# Patient Record
Sex: Female | Born: 1945 | Race: White | Hispanic: No | Smoking: Former smoker
Health system: Southern US, Community
[De-identification: ages and names within clinical notes are randomized; demographics above are authoritative.]

## PROBLEM LIST (undated history)

## (undated) DIAGNOSIS — D649 Anemia, unspecified: Secondary | ICD-10-CM

## (undated) DIAGNOSIS — C4490 Unspecified malignant neoplasm of skin, unspecified: Secondary | ICD-10-CM

## (undated) DIAGNOSIS — K219 Gastro-esophageal reflux disease without esophagitis: Secondary | ICD-10-CM

## (undated) DIAGNOSIS — M1990 Unspecified osteoarthritis, unspecified site: Secondary | ICD-10-CM

## (undated) DIAGNOSIS — H532 Diplopia: Secondary | ICD-10-CM

## (undated) DIAGNOSIS — E039 Hypothyroidism, unspecified: Secondary | ICD-10-CM

## (undated) DIAGNOSIS — C801 Malignant (primary) neoplasm, unspecified: Secondary | ICD-10-CM

## (undated) DIAGNOSIS — N321 Vesicointestinal fistula: Secondary | ICD-10-CM

## (undated) HISTORY — DX: Unspecified malignant neoplasm of skin, unspecified: C44.90

## (undated) HISTORY — PX: APPENDECTOMY: SHX54

## (undated) HISTORY — PX: CHOLECYSTECTOMY: SHX55

## (undated) HISTORY — DX: Diplopia: H53.2

## (undated) HISTORY — PX: TONSILLECTOMY: SUR1361

---

## 2013-12-05 ENCOUNTER — Encounter (HOSPITAL_COMMUNITY): Payer: Self-pay | Admitting: Emergency Medicine

## 2013-12-05 ENCOUNTER — Emergency Department (HOSPITAL_COMMUNITY): Payer: Medicare Other

## 2013-12-05 ENCOUNTER — Emergency Department (HOSPITAL_COMMUNITY)
Admission: EM | Admit: 2013-12-05 | Discharge: 2013-12-05 | Disposition: A | Discharge status: Home / Self Care | Payer: Medicare Other | Attending: Emergency Medicine | Admitting: Emergency Medicine

## 2013-12-05 DIAGNOSIS — IMO0002 Reserved for concepts with insufficient information to code with codable children: Secondary | ICD-10-CM | POA: Insufficient documentation

## 2013-12-05 DIAGNOSIS — M5416 Radiculopathy, lumbar region: Secondary | ICD-10-CM

## 2013-12-05 LAB — URINALYSIS, ROUTINE W REFLEX MICROSCOPIC
Bilirubin Urine: NEGATIVE
Glucose, UA: NEGATIVE mg/dL
Hgb urine dipstick: NEGATIVE
Ketones, ur: 15 mg/dL — AB
Leukocytes, UA: NEGATIVE
Nitrite: NEGATIVE
Protein, ur: NEGATIVE mg/dL
Specific Gravity, Urine: 1.015 (ref 1.005–1.030)
Urobilinogen, UA: 0.2 mg/dL (ref 0.0–1.0)
pH: 6 (ref 5.0–8.0)

## 2013-12-05 MED ORDER — PREDNISONE 20 MG PO TABS: 40.0000 mg | ORAL_TABLET | Freq: Every day | ORAL | Status: DC

## 2013-12-05 MED ORDER — HYDROCODONE-ACETAMINOPHEN 5-325 MG PO TABS
1.0000 | ORAL_TABLET | Freq: Once | ORAL | Status: AC
  Administered 2013-12-05: 1 via ORAL
  Filled 2013-12-05: qty 1

## 2013-12-05 MED ORDER — HYDROCODONE-ACETAMINOPHEN 5-325 MG PO TABS: 1.0000 | ORAL_TABLET | ORAL | Status: DC | PRN

## 2013-12-05 MED ORDER — KETOROLAC TROMETHAMINE 30 MG/ML IJ SOLN
30.0000 mg | Freq: Once | INTRAMUSCULAR | Status: AC
  Administered 2013-12-05: 30 mg via INTRAMUSCULAR
  Filled 2013-12-05: qty 1

## 2013-12-05 MED ORDER — ONDANSETRON 4 MG PO TBDP
4.0000 mg | ORAL_TABLET | Freq: Once | ORAL | Status: AC
  Administered 2013-12-05: 4 mg via ORAL
  Filled 2013-12-05: qty 1

## 2013-12-05 NOTE — ED Notes (Signed)
The pt is c/o lower back lt hipandlt leg pain for 3 days.  Worse last pm

## 2013-12-05 NOTE — ED Notes (Signed)
The pt has had back pain fpr months

## 2013-12-05 NOTE — ED Provider Notes (Addendum)
CSN: [id number]     Arrival date & time 12/05/13  0701 History   First MD Initiated Contact with Patient 12/05/13 [phone]     Chief Complaint  Patient presents with  . Back Pain     (Consider location/radiation/quality/duration/timing/severity/associated sxs/prior Treatment) Patient is a 68 y.o. female presenting with back pain. The history is provided by the patient.  Back Pain Location:  Lumbar spine Quality:  Aching, stabbing, stiffness and shooting Stiffness is present:  In the morning Radiates to:  L thigh (left hip and sometimes shoots down to the knee) Pain severity:  Severe Onset quality:  Gradual Duration:  3 days (states has had some mild intermittent pain in the past but nothing like the last 3 days) Timing:  Constant Progression:  Worsening Chronicity:  New Context comment:  Can't recall injury or trigger Relieved by:  Nothing Worsened by:  Ambulation, bending, standing and twisting Ineffective treatments:  Ibuprofen (aleve, icy hot) Associated symptoms: leg pain   Associated symptoms: no abdominal pain, no bladder incontinence, no bowel incontinence, no chest pain, no dysuria, no fever, no numbness, no paresthesias and no weakness   Risk factors: menopause   Risk factors: no hx of cancer, no recent surgery and no steroid use     History reviewed. No pertinent past medical history. History reviewed. No pertinent past surgical history. No family history on file. History  Substance Use Topics  . Smoking status: Never Smoker   . Smokeless tobacco: Not on file  . Alcohol Use: No   OB History   Grav Para Term Preterm Abortions TAB SAB Ect Mult Living                 Review of Systems  Constitutional: Negative for fever.  Cardiovascular: Negative for chest pain.  Gastrointestinal: Negative for abdominal pain and bowel incontinence.  Genitourinary: Negative for bladder incontinence and dysuria.  Musculoskeletal: Positive for back pain.  Neurological: Negative for  weakness, numbness and paresthesias.  All other systems reviewed and are negative.     Allergies  Review of patient's allergies indicates no known allergies.  Home Medications   Prior to Admission medications   Not on File   BP 168/68  Pulse 60  Temp(Src) 98.2 F (36.8 C) (Oral)  Resp 18  SpO2 98% Physical Exam  Nursing note and vitals reviewed. Constitutional: She is oriented to person, place, and time. She appears well-developed and well-nourished.  Appears uncomfortable  HENT:  Head: Normocephalic and atraumatic.  Eyes: EOM are normal. Pupils are equal, round, and reactive to light.  Cardiovascular: Normal rate, regular rhythm, normal heart sounds and intact distal pulses.  Exam reveals no friction rub.   No murmur heard. Pulmonary/Chest: Effort normal and breath sounds normal. She has no wheezes. She has no rales.  Abdominal: Soft. Bowel sounds are normal. She exhibits no distension. There is no tenderness. There is no rebound, no guarding and no CVA tenderness.  Musculoskeletal: Normal range of motion. She exhibits no tenderness.  No reproducible pain with palpation of the left hip or back.  Pain reproduced by pt movement.  2+ DP pulses on the left without edema  Neurological: She is alert and oriented to person, place, and time. She has normal strength. No cranial nerve deficit or sensory deficit.  Skin: Skin is warm and dry. No rash noted.  Psychiatric: She has a normal mood and affect. Her behavior is normal.    ED Course  Procedures (including critical care time) Labs Review  Labs Reviewed  URINALYSIS, ROUTINE W REFLEX MICROSCOPIC - Abnormal; Notable for the following:    Ketones, ur 15 (*)    All other components within normal limits    Imaging Review Dg Hip Complete Left  12/05/2013   CLINICAL DATA:  Three-day history of acute back and left hip pain without history of injury; three-month history of milder similar symptoms.  EXAM: LEFT HIP - COMPLETE 2+ VIEW   COMPARISON:  None.  FINDINGS: The bony pelvis is mildly osteopenic. There is no lytic or blastic lesion nor acute fracture. 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 The hip joint spaces are reasonably well-maintained for age. The femoral heads and necks and intertrochanteric regions are normal. The frog-leg lateral view of the left hip reveals no acute abnormality.  IMPRESSION: There is minimal age appropriate degenerative change of the hips but there is no acute bony abnormality.   Electronically Signed   By: David  Martinique   On: 12/05/2013 08:25     EKG Interpretation None      MDM   Final diagnoses:  Lumbar radiculopathy, acute    Pt with gradual onset of left hip pain which is most suggestive of radiculopathy in the L2-L3 distribution.  No neurovascular compromise and no incontinence.  Pt has no infectious sx, hx of CA  or other red flags concerning for pathologic back pain.  She has had mild pain in the past but nothing persistent.  No flank pain and low concern for kidney stone, UTI or AAA.  Pt is able to ambulate but is painful.  Normal strength and reflexes on exam.  Denies trauma. Hip films pending. Will give pt pain control and to return for developement of above sx.  9:31 AM UA without blood or infection.  Low suspicion from ks.  HIp film without pathologic lesions or other acute issues.  Feel pt has lumbar radiculopathy and treated conservatively.   Blanchie Dessert, MD 12/05/13 1027  Blanchie Dessert, MD 12/05/13 [phone]

## 2013-12-05 NOTE — Discharge Instructions (Signed)

## 2014-08-26 ENCOUNTER — Other Ambulatory Visit: Payer: Self-pay | Admitting: Orthopedic Surgery

## 2014-08-26 DIAGNOSIS — Z7409 Other reduced mobility: Secondary | ICD-10-CM

## 2014-08-26 DIAGNOSIS — M2560 Stiffness of unspecified joint, not elsewhere classified: Secondary | ICD-10-CM

## 2014-08-26 DIAGNOSIS — R52 Pain, unspecified: Secondary | ICD-10-CM

## 2014-09-10 ENCOUNTER — Ambulatory Visit
Admission: RE | Admit: 2014-09-10 | Discharge: 2014-09-10 | Disposition: A | Discharge status: Home / Self Care | Payer: Medicare Other | Source: Ambulatory Visit | Attending: Orthopedic Surgery | Admitting: Orthopedic Surgery

## 2014-09-10 DIAGNOSIS — M2560 Stiffness of unspecified joint, not elsewhere classified: Secondary | ICD-10-CM

## 2014-09-10 DIAGNOSIS — R52 Pain, unspecified: Secondary | ICD-10-CM

## 2014-09-10 DIAGNOSIS — Z7409 Other reduced mobility: Secondary | ICD-10-CM

## 2014-10-13 ENCOUNTER — Encounter: Payer: Self-pay | Admitting: Physical Therapy

## 2014-10-13 ENCOUNTER — Ambulatory Visit: Payer: Medicare Other | Attending: Orthopedic Surgery | Admitting: Physical Therapy

## 2014-10-13 DIAGNOSIS — M25562 Pain in left knee: Secondary | ICD-10-CM | POA: Insufficient documentation

## 2014-10-13 DIAGNOSIS — R29898 Other symptoms and signs involving the musculoskeletal system: Secondary | ICD-10-CM | POA: Diagnosis not present

## 2014-10-13 DIAGNOSIS — M25662 Stiffness of left knee, not elsewhere classified: Secondary | ICD-10-CM

## 2014-10-13 NOTE — Therapy (Signed)
[place], [place], [address] Phone: [phone]   Fax:  [phone]  Physical Therapy Evaluation  Patient Details  Name: Jackie Snow MRN: [id number] Date of Birth: 1946/01/23 Referring Provider:  Vickey Huger, MD  Encounter Date: 10/13/2014      PT End of Session - 10/13/14 0833    Visit Number 1   PT Start Time 7342   PT Stop Time 0836   PT Time Calculation (min) 41 min      History reviewed. No pertinent past medical history.  History reviewed. No pertinent past surgical history.  There were no vitals filed for this visit.  Visit Diagnosis:  Left knee pain - Plan: PT plan of care cert/re-cert  Decreased ROM of left knee - Plan: PT plan of care cert/re-cert      Subjective Assessment - 10/13/14 0756    Subjective Patient underwent a left knee arthroscopy on 09/28/14, a meniscus debridement.  Reports that she has had some pain, but biggest c/o is difficulty walking after sitting   Limitations Sitting;House hold activities;Walking   How long can you sit comfortably? 20   How long can you stand comfortably? 20   How long can you walk comfortably? 45   Patient Stated Goals no pain and easier walking   Currently in Pain? Yes   Pain Score 3    Pain Location Knee   Pain Orientation Left   Pain Descriptors / Indicators Aching;Tightness   Pain Type Surgical pain   Pain Onset More than a month ago   Pain Frequency Intermittent   Aggravating Factors  walking, standing, sitting for a long period pain will be up to 6/10   Pain Relieving Factors rest, ice and elevation  can have no pain   Effect of Pain on Daily Activities difficulty with ADL's            [facility] PT Assessment - 10/13/14 0001    Assessment   Medical Diagnosis s/p left knee scope   Onset Date 09/28/14   Precautions   Precautions None   Restrictions   Weight Bearing Restrictions No   Balance Screen   Has the patient  fallen in the past 6 months No   Has the patient had a decrease in activity level because of a fear of falling?  No   Is the patient reluctant to leave their home because of a fear of falling?  No   Home Environment   Additional Comments Does her own housework, has stairs at home   Prior Function   Vocation Requirements some walking on uneven terrain, stairs and a lot of sitting   Leisure was walking for exercise   ROM / Strength   AROM / PROM / Strength --  AROM of the left knee 10-92 degrees flexion, PROM 0-105   Flexibility   Soft Tissue Assessment /Muscle Length --  tight HS and calf as well as ITB   Ambulation/Gait   Gait Comments stairs are one at a time, slow for the first few steps with antalgic gait on the left, no device                   [facility] Adult PT Treatment/Exercise - 10/13/14 0001    Knee/Hip Exercises: Aerobic   Stationary Bike level 0 x 5 minutes   Isokinetic Nu Step Level 4 x 6 minutes  PT Education - 10/13/14 [phone]    Education provided Yes   Education Details quad and VMO activation, RICE and calf stretches   Person(s) Educated Patient   Methods Explanation;Demonstration;Verbal cues   Comprehension Verbalized understanding;Returned demonstration;Tactile cues required          PT Short Term Goals - 10/13/14 0835    PT SHORT TERM GOAL #1   Title independent with initial HEP   Time 1   Period Weeks   Status New           PT Long Term Goals - 10/13/14 1829    PT LONG TERM GOAL #1   Title decrease pain 50%   Time 8   Period Weeks   Status New   PT LONG TERM GOAL #2   Title increase AROM of the left knee to 3-115 degrees flexion   Time 8   Period Weeks   Status New   PT LONG TERM GOAL #3   Title walk all community distances without difficulty   Time 8   Period Weeks   Status New   PT LONG TERM GOAL #4   Title go up and down stairs step over step   Time 8   Period Weeks   Status New                Plan - 10/13/14 9371    Clinical Impression Statement Patient with left knee pain and stiffness after left knee scope with menicus debridement on 09/28/14.  Having difficulty with stairs and difficulty walking after sitting.   Pt will benefit from skilled therapeutic intervention in order to improve on the following deficits Abnormal gait;Decreased mobility;Decreased range of motion;Difficulty walking;Increased edema;Pain   Rehab Potential Good   PT Frequency 1x / week   PT Duration 6 weeks   PT Treatment/Interventions Electrical Stimulation;Cryotherapy;Stair training;Gait training;Therapeutic exercise;Patient/family education;Manual techniques   PT Next Visit Plan add gym exercises   Consulted and Agree with Plan of Care Patient         Problem List There are no active problems to display for this patient.   Sumner Boast, PT 10/13/2014, 8:38 AM  [address] [place] [place], [place], [address] Phone: [phone]   Fax:  [phone]

## 2014-10-20 ENCOUNTER — Ambulatory Visit: Payer: Medicare Other | Admitting: Physical Therapy

## 2014-10-20 DIAGNOSIS — M25662 Stiffness of left knee, not elsewhere classified: Secondary | ICD-10-CM

## 2014-10-20 DIAGNOSIS — M25562 Pain in left knee: Secondary | ICD-10-CM | POA: Diagnosis not present

## 2014-10-20 NOTE — Patient Instructions (Addendum)
Hip Flexion / Knee Extension: Straight-Leg Raise (Eccentric)   Lie on back. Lift leg with knee straight. Slowly lower leg for 3-5 seconds. 10___ reps per set, _1-3__ sets per day, __7 days per week. Lower like elevator, stopping at each floor.    ABDUCTION: Side-Lying (Active)   Lie on left side, top leg straight. Raise top leg as far as possible. Use _0__ lbs. Complete __1-3_ sets of _10__ repetitions. Perform _1__ sessions per day.  http://gtsc.exer.us/94   (Home) Extension: Hip   With support under abdomen, tighten stomach. Lift right leg in line with body. Do not hyperextend.  Repeat _10__ times per set. Do _1-3___ sets per session. Do __7__ sessions per week.  Flexors, Supine Bridge    HIP: Flexors - Supine   Lie on edge of surface. Place leg off the surface, allow knee to bend. Bring other knee toward chest. Hold _30_ seconds. 3__ reps per set, _2__ sets per day, __7_ days per week   Madelyn Flavors, PT 10/20/2014 9:54 AM [place]

## 2014-10-20 NOTE — Therapy (Signed)
[place], [place], [address] Phone: [phone]   Fax:  [phone]  Physical Therapy Treatment  Patient Details  Name: Jackie Snow MRN: [id number] Date of Birth: [birth year] Referring Provider:  Vickey Huger, MD  Encounter Date: 10/20/2014      PT End of Session - 10/20/14 0931    Visit Number 2   PT Start Time 2863   PT Stop Time 1011   PT Time Calculation (min) 43 min      No past medical history on file.  No past surgical history on file.  There were no vitals filed for this visit.  Visit Diagnosis:  Decreased ROM of left knee      Subjective Assessment - 10/20/14 0931    Subjective No new complaints. 95% feeling good.   Currently in Pain? No/denies  small discomfort but wouldn't consider pain                         [facility] Adult PT Treatment/Exercise - 10/20/14 0001    Exercises   Exercises Knee/Hip   Knee/Hip Exercises: Stretches   Passive Hamstring Stretch 2 reps;30 seconds   Quad Stretch 2 reps;30 seconds  with strap   Hip Flexor Stretch 2 reps;30 seconds  off bed   Knee/Hip Exercises: Aerobic   Stationary Bike level 1 x 5 minutes  adjusting seat from 5 to 3   Isokinetic Nu Step Level 4 x 6 minutes   Knee/Hip Exercises: Standing   Lateral Step Up Left;1 set;10 reps;Hand Hold: 0  6 inch step   Forward Step Up 1 set;10 reps;Hand Hold: 0  4 inch and 6 inch steps    Knee/Hip Exercises: Supine   Short Arc Quad Sets Strengthening;15 reps  5 sec hold   Straight Leg Raises Strengthening;Left;10 reps   Knee/Hip Exercises: Sidelying   Hip ABduction Strengthening;Left;10 reps   Knee/Hip Exercises: Prone   Hamstring Curl 1 set;10 reps  increase weight next time   Hip Extension Strengthening;Left;10 reps                PT Education - 10/20/14 1012    Education provided Yes   Education Details 3 way SLR; HF stretfch for hep   Person(s) Educated  Patient   Methods Explanation;Demonstration;Handout   Comprehension Verbalized understanding;Returned demonstration          PT Short Term Goals - 10/13/14 0835    PT SHORT TERM GOAL #1   Title independent with initial HEP   Time 1   Period Weeks   Status New           PT Long Term Goals - 10/13/14 8177    PT LONG TERM GOAL #1   Title decrease pain 50%   Time 8   Period Weeks   Status New   PT LONG TERM GOAL #2   Title increase AROM of the left knee to 3-115 degrees flexion   Time 8   Period Weeks   Status New   PT LONG TERM GOAL #3   Title walk all community distances without difficulty   Time 8   Period Weeks   Status New   PT LONG TERM GOAL #4   Title go up and down stairs step over step   Time 8   Period Weeks   Status New  Plan - 10/20/14 1013    Clinical Impression Statement patient tolerated exercise well without increase iin pain. No goalls met as only second visit.   PT Next Visit Plan continue strenghening/rom   Consulted and Agree with Plan of Care Patient        Problem List There are no active problems to display for this patient.   Madelyn Flavors PT  10/20/2014, 12:38 PM  [address] [place], [place], [address] Phone: [phone]   Fax:  [phone]

## 2014-10-27 ENCOUNTER — Encounter: Payer: Self-pay | Admitting: Physical Therapy

## 2014-10-27 ENCOUNTER — Ambulatory Visit: Payer: Medicare Other | Admitting: Physical Therapy

## 2014-10-27 DIAGNOSIS — M25662 Stiffness of left knee, not elsewhere classified: Secondary | ICD-10-CM

## 2014-10-27 DIAGNOSIS — M25562 Pain in left knee: Secondary | ICD-10-CM | POA: Diagnosis not present

## 2014-10-27 NOTE — Therapy (Signed)
[place] [address] [place], [place], [address] Phone: [phone]   Fax:  [phone]  Physical Therapy Treatment  Patient Details  Name: Jackie Snow MRN: [id number] Date of Birth: [birth year] Referring Provider:  Vickey Huger, MD  Encounter Date: 10/27/2014      PT End of Session - 10/27/14 1103    Visit Number 3   PT Start Time 1010   PT Stop Time 4627   PT Time Calculation (min) 43 min   Activity Tolerance Patient tolerated treatment well      History reviewed. No pertinent past medical history.  History reviewed. No pertinent past surgical history.  There were no vitals filed for this visit.  Visit Diagnosis:  Decreased ROM of left knee  Left knee pain      Subjective Assessment - 10/27/14 1013    Subjective Still feeling good.  I want to do stairs better   Currently in Pain? No/denies                         [facility] Adult PT Treatment/Exercise - 10/27/14 0001    Ambulation/Gait   Ambulation/Gait Yes   Ambulation/Gait Assistance 5: Supervision   Ambulation Distance (Feet) 700 Feet   Assistive device None   Ambulation Surface Outdoor   Gait Comments worked with her on stairs with a reciprocal pattern, very difficult for her, requires cues and CGA.   Knee/Hip Exercises: Stretches   Passive Hamstring Stretch 2 reps;30 seconds   Quad Stretch 2 reps;30 seconds   Hip Flexor Stretch 2 reps;30 seconds   Knee/Hip Exercises: Aerobic   Stationary Bike level 1 x 5 minutes   Isokinetic Nu Step Level 5 x 6 minutes   Knee/Hip Exercises: Machines for Strengthening   Cybex Knee Extension 5#, unable due to severe crepitus   Cybex Knee Flexion 25# 2x10   Cybex Leg Press 20# small arc of motion                  PT Short Term Goals - 10/13/14 0835    PT SHORT TERM GOAL #1   Title independent with initial HEP   Time 1   Period Weeks   Status New           PT Long Term  Goals - 10/13/14 0350    PT LONG TERM GOAL #1   Title decrease pain 50%   Time 8   Period Weeks   Status New   PT LONG TERM GOAL #2   Title increase AROM of the left knee to 3-115 degrees flexion   Time 8   Period Weeks   Status New   PT LONG TERM GOAL #3   Title walk all community distances without difficulty   Time 8   Period Weeks   Status New   PT LONG TERM GOAL #4   Title go up and down stairs step over step   Time 8   Period Weeks   Status New               Plan - 10/27/14 1131    Clinical Impression Statement Doing well, has great difficulty with stairs, needing cues, however it seems to be worse with her right knee, a lot of bone on bone crepitus with any open chain extension exercises   PT Next Visit Plan continue strenghening/rom, functional mobility   Consulted and Agree with Plan of Care Patient  Problem List There are no active problems to display for this patient.   Sumner Boast., PT 10/27/2014, 11:51 AM  [address] [place], [place], [address] Phone: [phone]   Fax:  [phone]

## 2014-11-03 ENCOUNTER — Encounter: Payer: Self-pay | Admitting: Physical Therapy

## 2014-11-03 ENCOUNTER — Ambulatory Visit: Payer: Medicare Other | Attending: Orthopedic Surgery | Admitting: Physical Therapy

## 2014-11-03 DIAGNOSIS — M25562 Pain in left knee: Secondary | ICD-10-CM | POA: Diagnosis present

## 2014-11-03 DIAGNOSIS — M25662 Stiffness of left knee, not elsewhere classified: Secondary | ICD-10-CM

## 2014-11-03 DIAGNOSIS — R29898 Other symptoms and signs involving the musculoskeletal system: Secondary | ICD-10-CM | POA: Diagnosis not present

## 2014-11-03 NOTE — Therapy (Signed)
[place], [place], [address] Phone: [phone]   Fax:  [phone]  Physical Therapy Treatment  Patient Details  Name: Jackie Snow MRN: [id number] Date of Birth: 04/24/1946 Referring Provider:  Vickey Huger, MD  Encounter Date: 11/03/2014      PT End of Session - 11/03/14 1006    Visit Number 4   PT Start Time 0936   PT Stop Time 1020   PT Time Calculation (min) 44 min      History reviewed. No pertinent past medical history.  History reviewed. No pertinent past surgical history.  There were no vitals filed for this visit.  Visit Diagnosis:  Decreased ROM of left knee  Left knee pain      Subjective Assessment - 11/03/14 0949    Subjective i think I am sleeping wrong, knee so stiff I need to use walker    Currently in Pain? Yes   Pain Score 5    Pain Location Knee   Pain Orientation Left            [facility] PT Assessment - 11/03/14 0001    ROM / Strength   AROM / PROM / Strength AROM   AROM   Overall AROM Comments Left knee seated 3-118                     [facility] Adult PT Treatment/Exercise - 11/03/14 0001    Knee/Hip Exercises: Aerobic   Stationary Bike 6 min   Isokinetic Nu Step Level 5 x 6 minutes   Knee/Hip Exercises: Standing   Other Standing Knee Exercises hip flex with knee ext 3# 2 sets 10   Other Standing Knee Exercises red tband hip 3 way bialteral 15 times   Knee/Hip Exercises: Seated   Long Arc Quad Strengthening;Left;10 reps;2 sets   Manual Therapy   Manual Therapy Joint mobilization  belt mobs for joint stiffness                PT Education - 11/03/14 1005    Education provided Yes   Education Details HEP hip 3 way and LAQ with red tband   Person(s) Educated Patient   Methods Explanation;Demonstration;Handout   Comprehension Returned demonstration;Verbalized understanding          PT Short Term Goals - 11/03/14 1007    PT  SHORT TERM GOAL #1   Title independent with initial HEP   Status Achieved           PT Long Term Goals - 11/03/14 1015    PT LONG TERM GOAL #1   Title decrease pain 50%   Status On-going   PT LONG TERM GOAL #2   Title increase AROM of the left knee to 3-115 degrees flexion   Status Achieved   PT LONG TERM GOAL #3   Title walk all community distances without difficulty   Status Achieved   PT LONG TERM GOAL #4   Title go up and down stairs step over step   Status On-going               Plan - 11/03/14 1007    Clinical Impression Statement expalined to pt better sleeping postions and use of pillow to decrease stiffness. Added to HEP. Discussed okay to use stationary bike and TM but no jogging.Discussed surgery and joint deterioration and need for strengthening.   PT Next Visit Plan MD 6/7, quad strengthening  Problem List There are no active problems to display for this patient.   Fredrick Dray,ANGIE PTA 11/03/2014, 10:16 AM  [place] [address] [place], [place], [address] Phone: [phone]   Fax:  [phone]

## 2014-11-10 ENCOUNTER — Encounter: Payer: Self-pay | Admitting: Physical Therapy

## 2014-11-10 ENCOUNTER — Ambulatory Visit: Payer: Medicare Other | Admitting: Physical Therapy

## 2014-11-10 DIAGNOSIS — R29898 Other symptoms and signs involving the musculoskeletal system: Secondary | ICD-10-CM | POA: Diagnosis not present

## 2014-11-10 DIAGNOSIS — M25562 Pain in left knee: Secondary | ICD-10-CM

## 2014-11-10 NOTE — Therapy (Signed)
[place], [place], [address] Phone: [phone]   Fax:  [phone]  Physical Therapy Treatment  Patient Details  Name: Jackie Snow MRN: [id number] Date of Birth: Oct 06, 1945 Referring Provider:  Vickey Huger, MD  Encounter Date: 11/10/2014      PT End of Session - 11/10/14 1004    Visit Number 5   PT Start Time 0932   PT Stop Time 1017   PT Time Calculation (min) 43 min      History reviewed. No pertinent past medical history.  History reviewed. No pertinent past surgical history.  There were no vitals filed for this visit.  Visit Diagnosis:  Left knee pain      Subjective Assessment - 11/10/14 0933    Subjective about the same really, slepping some better, better once up moving, mixed up MD appt so reschedule for this coming Tuesday   Pain Score 3    Pain Location Knee   Pain Orientation Left                         [facility] Adult PT Treatment/Exercise - 11/10/14 0001    Knee/Hip Exercises: Aerobic   Elliptical 2 fwd/2 backward I 5 R 4   Isokinetic Nu Step Level 5 x 6 minutes   Knee/Hip Exercises: Machines for Strengthening   Cybex Knee Flexion 25# 2x15   Cybex Leg Press 30# small arc of motion 3 sets 10   Knee/Hip Exercises: Standing   Lateral Step Up Left;2 sets;10 reps  6 inch   Forward Step Up Left;2 sets;10 reps  6 inch   Rebounder 40# 5 times fwd/back and 3 times each way   Other Standing Knee Exercises hip flex with knee ext 4# 2 sets 10   Other Standing Knee Exercises red tband hip 3 way bialteral 15 times   Knee/Hip Exercises: Seated   Long Arc Quad Strengthening;Left;2 sets;10 reps  3 sec TKE hold 4#                PT Education - 11/10/14 0936    Education provided Yes   Education Details pt stated not doing HEP as needed- stressed importance of stretching and strengthening   Person(s) Educated Patient   Methods Explanation   Comprehension Verbalized understanding          PT Short Term Goals - 11/03/14 1007    PT SHORT TERM GOAL #1   Title independent with initial HEP   Status Achieved           PT Long Term Goals - 11/10/14 0935    PT LONG TERM GOAL #1   Title decrease pain 50%   Status On-going   PT LONG TERM GOAL #2   Title increase AROM of the left knee to 3-115 degrees flexion   Status On-going   PT LONG TERM GOAL #3   Title walk all community distances without difficulty   Status On-going   PT LONG TERM GOAL #4   Title go up and down stairs step over step   Status On-going               Plan - 11/10/14 1005    Clinical Impression Statement pt tolerated all ther well. good ROM but weak muscles esp quad, spent time reexplaining importance of ther ex and strength to help unload joint and decrease strain with decreased meniscus  Problem List There are no active problems to display for this patient.   Chaitra Mast,ANGIE PTA 11/10/2014, 10:06 AM  [place], [place], [address] Phone: [phone]   Fax:  [phone]

## 2014-11-17 ENCOUNTER — Ambulatory Visit: Payer: Medicare Other | Admitting: Rehabilitation

## 2014-11-17 DIAGNOSIS — M25562 Pain in left knee: Secondary | ICD-10-CM

## 2014-11-17 DIAGNOSIS — R29898 Other symptoms and signs involving the musculoskeletal system: Secondary | ICD-10-CM | POA: Diagnosis not present

## 2014-11-17 NOTE — Therapy (Addendum)
[place], [place], [address] Phone: [phone]   Fax:  [phone]  Physical Therapy Treatment  Patient Details  Name: Jackie Snow MRN: [id number] Date of Birth: May 25, 1946 Referring Provider:  Vickey Huger, MD  Encounter Date: 11/17/2014      PT End of Session - 11/17/14 0902    Visit Number 6   PT Start Time 0848   PT Stop Time 0940   PT Time Calculation (min) 52 min      No past medical history on file.  No past surgical history on file.  There were no vitals filed for this visit.  Visit Diagnosis:  Left knee pain      Subjective Assessment - 11/17/14 0902    Subjective States saw Dr Ronnie Derby since last visit, and was advised she needed to be doing gym ex TIW.     States is going to join a gym so she can do these exercises on her own.   Pain Score 2    Pain Location Knee   Pain Orientation Left   Pain Descriptors / Indicators Aching                         [facility] Adult PT Treatment/Exercise - 11/17/14 0001    Knee/Hip Exercises: Aerobic   Elliptical I5 R4 3'F/3" B   Isokinetic Nu step x 6' L4   Knee/Hip Exercises: Machines for Strengthening   Cybex Knee Extension 5#, 2/10 BLE   Cybex Knee Flexion 10# x20 LLE only   Cybex Leg Press 40# x 20   Knee/Hip Exercises: Standing   Walking with Sports Cord 15# F/B, S/S x 8 lap   Other Standing Knee Exercises Red TB 3way hip x 20   Knee/Hip Exercises: Seated   Long Arc Quad 20 reps   Knee/Hip Exercises: Supine   Quad Sets 20 reps                PT Education - 11/17/14 0946    Education provided Yes   Education Details gym exercise program recommendations and HEP review   Person(s) Educated Patient   Methods Explanation;Demonstration   Comprehension Verbalized understanding;Need further instruction          PT Short Term Goals - 11/03/14 1007    PT SHORT TERM GOAL #1   Title independent with initial  HEP   Status Achieved           PT Long Term Goals - 11/10/14 0935    PT LONG TERM GOAL #1   Title decrease pain 50%   Status On-going   PT LONG TERM GOAL #2   Title increase AROM of the left knee to 3-115 degrees flexion   Status On-going   PT LONG TERM GOAL #3   Title walk all community distances without difficulty   Status On-going   PT LONG TERM GOAL #4   Title go up and down stairs step over step   Status On-going               Plan - 11/17/14 0947    Clinical Impression Statement Pt. doing well overall.    Still has crepitus L knee with resisted knee extension with some pain.      PT Next Visit Plan Review gym program and home program and d/c.     PHYSICAL THERAPY DISCHARGE SUMMARY  Visits from Start of Care:6  Plan: Patient agrees to discharge.  Patient goals were partially met. Patient is being discharged due to not returning since the last visit.  ?????        Problem List There are no active problems to display for this patient.   Volney American, PT 11/17/2014, 9:50 AM  [address] [place], [place], [address] Phone: [phone]   Fax:  [phone]

## 2014-11-23 ENCOUNTER — Ambulatory Visit: Payer: Medicare Other | Admitting: Physical Therapy

## 2014-12-01 ENCOUNTER — Ambulatory Visit: Payer: Medicare Other | Admitting: Physical Therapy

## 2016-06-17 DIAGNOSIS — Z23 Encounter for immunization: Secondary | ICD-10-CM | POA: Diagnosis not present

## 2016-07-01 ENCOUNTER — Other Ambulatory Visit: Payer: Self-pay | Admitting: General Surgery

## 2016-07-01 DIAGNOSIS — N321 Vesicointestinal fistula: Secondary | ICD-10-CM | POA: Diagnosis not present

## 2016-08-02 DIAGNOSIS — K5792 Diverticulitis of intestine, part unspecified, without perforation or abscess without bleeding: Secondary | ICD-10-CM | POA: Diagnosis not present

## 2016-08-02 DIAGNOSIS — N820 Vesicovaginal fistula: Secondary | ICD-10-CM | POA: Diagnosis not present

## 2016-08-08 ENCOUNTER — Other Ambulatory Visit: Payer: Self-pay | Admitting: Family Medicine

## 2016-08-08 DIAGNOSIS — R1032 Left lower quadrant pain: Secondary | ICD-10-CM

## 2016-08-12 ENCOUNTER — Other Ambulatory Visit: Payer: Medicare Other

## 2016-08-14 ENCOUNTER — Ambulatory Visit
Admission: RE | Admit: 2016-08-14 | Discharge: 2016-08-14 | Disposition: A | Discharge status: Home / Self Care | Payer: Medicare HMO | Source: Ambulatory Visit | Attending: Family Medicine | Admitting: Family Medicine

## 2016-08-14 ENCOUNTER — Other Ambulatory Visit: Payer: Self-pay | Admitting: Urology

## 2016-08-14 DIAGNOSIS — R1032 Left lower quadrant pain: Secondary | ICD-10-CM

## 2016-08-14 MED ORDER — IOPAMIDOL (ISOVUE-300) INJECTION 61%
100.0000 mL | Freq: Once | INTRAVENOUS | Status: AC | PRN
  Administered 2016-08-14: 100 mL via INTRAVENOUS

## 2016-09-30 NOTE — Patient Instructions (Addendum)
Jackie Snow  09/30/2016   Your procedure is scheduled on: 10-09-16  Report to [facility] Main  Entrance Take [place]  elevators to 3rd floor to  [place] at 630AM.   Call this number if you have problems the morning of surgery [phone]   Remember: ONLY 1 PERSON MAY GO WITH YOU TO SHORT STAY TO GET  READY MORNING OF [place].    Do not eat food or After Midnight on Monday 10-07-16. Drink plenty of clear liquids all day Tuesday 10-08-16 and follow all of Dr Leighton Ruff instructions for bowel prep. Nothing by mouth after midnight on Tuesday !!     Take these medicines the morning of surgery with A SIP OF WATER: SYNTHROID                                You may not have any metal on your body including hair pins and              piercings  Do not wear jewelry, make-up, lotions, powders or perfumes, deodorant             Do not wear nail polish.  Do not shave  48 hours prior to surgery.       Do not bring valuables to the hospital. [place].  Contacts, dentures or bridgework may not be worn into surgery.  Leave suitcase in the car. After surgery it may be brought to your room.                 Please read over the following fact sheets you were given: _____________________________________________________________________     CLEAR LIQUID DIET   Foods Allowed                                                                     Foods Excluded  Coffee and tea, regular and decaf                             liquids that you cannot  Plain Jell-O in any flavor                                             see through such as: Fruit ices (not with fruit pulp)                                     milk, soups, orange juice  Iced Popsicles                                    All solid food Carbonated beverages, regular and diet  Cranberry, grape and apple juices Sports drinks  like Gatorade Lightly seasoned clear broth or consume(fat free) Sugar, honey syrup  Sample Menu Breakfast                                Lunch                                     Supper Cranberry juice                    Beef broth                            Chicken broth Jell-O                                     Grape juice                           Apple juice Coffee or tea                        Jell-O                                      Popsicle                                                Coffee or tea                        Coffee or tea  _____________________________________________________________________  [facility] - Preparing for Surgery Before surgery, you can play an important role.  Because skin is not sterile, your skin needs to be as free of germs as possible.  You can reduce the number of germs on your skin by washing with CHG (chlorahexidine gluconate) soap before surgery.  CHG is an antiseptic cleaner which kills germs and bonds with the skin to continue killing germs even after washing. Please DO NOT use if you have an allergy to CHG or antibacterial soaps.  If your skin becomes reddened/irritated stop using the CHG and inform your nurse when you arrive at Short Stay. Do not shave (including legs and underarms) for at least 48 hours prior to the first CHG shower.  You may shave your face/neck. Please follow these instructions carefully:  1.  Shower with CHG Soap the night before surgery and the  morning of Surgery.  2.  If you choose to wash your hair, wash your hair first as usual with your  normal  shampoo.  3.  After you shampoo, rinse your hair and body thoroughly to remove the  shampoo.                           4.  Use CHG as you would any other liquid soap.  You can apply chg directly  to the skin and wash  Gently with a scrungie or clean washcloth.  5.  Apply the CHG Soap to your body ONLY FROM THE NECK DOWN.   Do not use on face/ open                            Wound or open sores. Avoid contact with eyes, ears mouth and genitals (private parts).                       Wash face,  Genitals (private parts) with your normal soap.             6.  Wash thoroughly, paying special attention to the area where your surgery  will be performed.  7.  Thoroughly rinse your body with warm water from the neck down.  8.  DO NOT shower/wash with your normal soap after using and rinsing off  the CHG Soap.                9.  Pat yourself dry with a clean towel.            10.  Wear clean pajamas.            11.  Place clean sheets on your bed the night of your first shower and do not  sleep with pets. Day of Surgery : Do not apply any lotions/deodorants the morning of surgery.  Please wear clean clothes to the hospital/surgery center.  FAILURE TO FOLLOW THESE INSTRUCTIONS MAY RESULT IN THE CANCELLATION OF YOUR SURGERY PATIENT SIGNATURE_________________________________  NURSE SIGNATURE__________________________________  ________________________________________________________________________

## 2016-09-30 NOTE — Progress Notes (Signed)
[place] MD PLACE ORDERS IN EPIC . PATIENT HAS PRE-OP ON 10-02-16 . DR HERRICK'S ORDERS ARE ALREADY IN FOR HIS PART. THANKS

## 2016-10-02 ENCOUNTER — Encounter (HOSPITAL_COMMUNITY): Payer: Self-pay

## 2016-10-02 ENCOUNTER — Encounter (INDEPENDENT_AMBULATORY_CARE_PROVIDER_SITE_OTHER): Payer: Self-pay

## 2016-10-02 ENCOUNTER — Encounter (HOSPITAL_COMMUNITY)
Admission: RE | Admit: 2016-10-02 | Discharge: 2016-10-02 | Disposition: A | Discharge status: Home / Self Care | Payer: Medicare HMO | Source: Ambulatory Visit | Attending: General Surgery | Admitting: General Surgery

## 2016-10-02 DIAGNOSIS — K632 Fistula of intestine: Secondary | ICD-10-CM | POA: Diagnosis not present

## 2016-10-02 DIAGNOSIS — Z01812 Encounter for preprocedural laboratory examination: Secondary | ICD-10-CM | POA: Insufficient documentation

## 2016-10-02 HISTORY — DX: Hypothyroidism, unspecified: E03.9

## 2016-10-02 HISTORY — DX: Gastro-esophageal reflux disease without esophagitis: K21.9

## 2016-10-02 HISTORY — DX: Malignant (primary) neoplasm, unspecified: C80.1

## 2016-10-02 HISTORY — DX: Anemia, unspecified: D64.9

## 2016-10-02 HISTORY — DX: Vesicointestinal fistula: N32.1

## 2016-10-02 HISTORY — DX: Unspecified osteoarthritis, unspecified site: M19.90

## 2016-10-02 LAB — BASIC METABOLIC PANEL
Anion gap: 6 (ref 5–15)
BUN: 17 mg/dL (ref 6–20)
CALCIUM: 9 mg/dL (ref 8.9–10.3)
CHLORIDE: 105 mmol/L (ref 101–111)
CO2: 26 mmol/L (ref 22–32)
CREATININE: 0.75 mg/dL (ref 0.44–1.00)
GFR calc non Af Amer: >60 mL/min (ref >60)
Glucose, Bld: 89 mg/dL (ref 65–99)
Potassium: 3.8 mmol/L (ref 3.5–5.1)
SODIUM: 137 mmol/L (ref 135–145)

## 2016-10-02 LAB — ABO/RH: ABO/RH(D): A POS

## 2016-10-02 LAB — CBC
HCT: 41.5 % (ref 36.0–46.0)
Hemoglobin: 13.5 g/dL (ref 12.0–15.0)
MCH: 30.1 pg (ref 26.0–34.0)
MCHC: 32.5 g/dL (ref 30.0–36.0)
MCV: 92.4 fL (ref 78.0–100.0)
PLATELETS: 236 10*3/uL (ref 150–400)
RBC: 4.49 MIL/uL (ref 3.87–5.11)
RDW: 14 % (ref 11.5–15.5)
WBC: 5.1 10*3/uL (ref 4.0–10.5)

## 2016-10-02 NOTE — Progress Notes (Signed)
Called over to [facility] and spoke to [place] in nursing. Requested that Dr Leighton Ruff place orders in epic for upcoming surgery. RN informed that Marcello Moores will be out of office until tomorrow and will likely put in orders then.

## 2016-10-03 ENCOUNTER — Other Ambulatory Visit: Payer: Self-pay | Admitting: General Surgery

## 2016-10-04 DIAGNOSIS — Z0000 Encounter for general adult medical examination without abnormal findings: Secondary | ICD-10-CM | POA: Diagnosis not present

## 2016-10-04 DIAGNOSIS — Z87891 Personal history of nicotine dependence: Secondary | ICD-10-CM | POA: Diagnosis not present

## 2016-10-04 DIAGNOSIS — K5792 Diverticulitis of intestine, part unspecified, without perforation or abscess without bleeding: Secondary | ICD-10-CM | POA: Diagnosis not present

## 2016-10-04 DIAGNOSIS — Z6833 Body mass index (BMI) 33.0-33.9, adult: Secondary | ICD-10-CM | POA: Diagnosis not present

## 2016-10-04 DIAGNOSIS — Z79899 Other long term (current) drug therapy: Secondary | ICD-10-CM | POA: Diagnosis not present

## 2016-10-04 DIAGNOSIS — E669 Obesity, unspecified: Secondary | ICD-10-CM | POA: Diagnosis not present

## 2016-10-04 DIAGNOSIS — E039 Hypothyroidism, unspecified: Secondary | ICD-10-CM | POA: Diagnosis not present

## 2016-10-04 DIAGNOSIS — E785 Hyperlipidemia, unspecified: Secondary | ICD-10-CM | POA: Diagnosis not present

## 2016-10-04 DIAGNOSIS — M130 Polyarthritis, unspecified: Secondary | ICD-10-CM | POA: Diagnosis not present

## 2016-10-09 ENCOUNTER — Inpatient Hospital Stay (HOSPITAL_COMMUNITY)
Admission: RE | Admit: 2016-10-09 | Discharge: 2016-10-12 | DRG: 983 | Disposition: A | Discharge status: Home / Self Care | Payer: Medicare HMO | Source: Ambulatory Visit | Attending: General Surgery | Admitting: General Surgery

## 2016-10-09 ENCOUNTER — Inpatient Hospital Stay (HOSPITAL_COMMUNITY): Payer: Medicare HMO | Admitting: Registered Nurse

## 2016-10-09 ENCOUNTER — Encounter (HOSPITAL_COMMUNITY): Payer: Self-pay | Admitting: *Deleted

## 2016-10-09 ENCOUNTER — Encounter (HOSPITAL_COMMUNITY)
Admission: RE | Disposition: A | Discharge status: Home / Self Care | Payer: Self-pay | Source: Ambulatory Visit | Attending: General Surgery

## 2016-10-09 DIAGNOSIS — Z79899 Other long term (current) drug therapy: Secondary | ICD-10-CM

## 2016-10-09 DIAGNOSIS — E039 Hypothyroidism, unspecified: Secondary | ICD-10-CM | POA: Diagnosis not present

## 2016-10-09 DIAGNOSIS — K5790 Diverticulosis of intestine, part unspecified, without perforation or abscess without bleeding: Secondary | ICD-10-CM | POA: Diagnosis present

## 2016-10-09 DIAGNOSIS — N321 Vesicointestinal fistula: Secondary | ICD-10-CM | POA: Diagnosis not present

## 2016-10-09 DIAGNOSIS — K632 Fistula of intestine: Secondary | ICD-10-CM | POA: Diagnosis not present

## 2016-10-09 DIAGNOSIS — N824 Other female intestinal-genital tract fistulae: Secondary | ICD-10-CM | POA: Diagnosis present

## 2016-10-09 DIAGNOSIS — K5780 Diverticulitis of intestine, part unspecified, with perforation and abscess without bleeding: Secondary | ICD-10-CM | POA: Diagnosis not present

## 2016-10-09 DIAGNOSIS — K5720 Diverticulitis of large intestine with perforation and abscess without bleeding: Secondary | ICD-10-CM | POA: Diagnosis not present

## 2016-10-09 HISTORY — PX: CYSTOSCOPY WITH INJECTION: SHX1424

## 2016-10-09 SURGERY — COLECTOMY, PARTIAL, ROBOT-ASSISTED, LAPAROSCOPIC
Anesthesia: General | Site: Ureter

## 2016-10-09 MED ORDER — LACTATED RINGERS IV SOLN
INTRAVENOUS | Status: DC | PRN
  Administered 2016-10-09 (×3): via INTRAVENOUS

## 2016-10-09 MED ORDER — PROPOFOL 10 MG/ML IV BOLUS
INTRAVENOUS | Status: DC | PRN
  Administered 2016-10-09: 150 mg via INTRAVENOUS
  Administered 2016-10-09: 20 mg via INTRAVENOUS
  Administered 2016-10-09: 30 mg via INTRAVENOUS

## 2016-10-09 MED ORDER — SUGAMMADEX SODIUM 200 MG/2ML IV SOLN
INTRAVENOUS | Status: DC | PRN
  Administered 2016-10-09: 200 mg via INTRAVENOUS

## 2016-10-09 MED ORDER — ONDANSETRON HCL 4 MG/2ML IJ SOLN
INTRAMUSCULAR | Status: AC
  Filled 2016-10-09: qty 2

## 2016-10-09 MED ORDER — LIDOCAINE HCL (CARDIAC) 20 MG/ML IV SOLN
INTRAVENOUS | Status: DC | PRN
  Administered 2016-10-09: 75 mg via INTRAVENOUS
  Administered 2016-10-09: 25 mg via INTRATRACHEAL

## 2016-10-09 MED ORDER — SODIUM CHLORIDE 0.9 % IJ SOLN
INTRAMUSCULAR | Status: AC
  Filled 2016-10-09: qty 20

## 2016-10-09 MED ORDER — BUPIVACAINE HCL (PF) 0.25 % IJ SOLN
INTRAMUSCULAR | Status: DC | PRN
  Administered 2016-10-09: 30 mL

## 2016-10-09 MED ORDER — ONDANSETRON HCL 4 MG/2ML IJ SOLN: 4.0000 mg | Freq: Four times a day (QID) | INTRAMUSCULAR | Status: DC | PRN

## 2016-10-09 MED ORDER — 0.9 % SODIUM CHLORIDE (POUR BTL) OPTIME
TOPICAL | Status: DC | PRN
  Administered 2016-10-09: 2000 mL

## 2016-10-09 MED ORDER — HYDROMORPHONE HCL 1 MG/ML IJ SOLN
0.2500 mg | INTRAMUSCULAR | Status: DC | PRN
  Administered 2016-10-09 (×2): 0.5 mg via INTRAVENOUS

## 2016-10-09 MED ORDER — KCL IN DEXTROSE-NACL 20-5-0.45 MEQ/L-%-% IV SOLN
INTRAVENOUS | Status: DC
  Administered 2016-10-09: 100 mL/h via INTRAVENOUS
  Administered 2016-10-10: 20:00:00 via INTRAVENOUS
  Filled 2016-10-09 (×4): qty 1000

## 2016-10-09 MED ORDER — MIDAZOLAM HCL 2 MG/2ML IJ SOLN
INTRAMUSCULAR | Status: AC
  Filled 2016-10-09: qty 2

## 2016-10-09 MED ORDER — ACETAMINOPHEN 10 MG/ML IV SOLN
INTRAVENOUS | Status: AC
  Filled 2016-10-09: qty 100

## 2016-10-09 MED ORDER — DEXAMETHASONE SODIUM PHOSPHATE 10 MG/ML IJ SOLN
INTRAMUSCULAR | Status: DC | PRN
  Administered 2016-10-09: 10 mg via INTRAVENOUS

## 2016-10-09 MED ORDER — ALVIMOPAN 12 MG PO CAPS
12.0000 mg | ORAL_CAPSULE | ORAL | Status: AC
  Administered 2016-10-09: 12 mg via ORAL
  Filled 2016-10-09: qty 1

## 2016-10-09 MED ORDER — MEPERIDINE HCL 50 MG/ML IJ SOLN: 6.2500 mg | INTRAMUSCULAR | Status: DC | PRN

## 2016-10-09 MED ORDER — OXYCODONE HCL 5 MG/5ML PO SOLN: 5.0000 mg | Freq: Once | ORAL | Status: DC | PRN

## 2016-10-09 MED ORDER — BUPIVACAINE LIPOSOME 1.3 % IJ SUSP
20.0000 mL | Freq: Once | INTRAMUSCULAR | Status: AC
  Administered 2016-10-09: 20 mL
  Filled 2016-10-09: qty 20

## 2016-10-09 MED ORDER — ACETAMINOPHEN 10 MG/ML IV SOLN
INTRAVENOUS | Status: DC | PRN
  Administered 2016-10-09: 1000 mg via INTRAVENOUS

## 2016-10-09 MED ORDER — PROMETHAZINE HCL 25 MG/ML IJ SOLN: 6.2500 mg | INTRAMUSCULAR | Status: DC | PRN

## 2016-10-09 MED ORDER — LACTATED RINGERS IV SOLN
INTRAVENOUS | Status: DC | PRN
  Administered 2016-10-09: 08:00:00 via INTRAVENOUS

## 2016-10-09 MED ORDER — LACTATED RINGERS IV SOLN: INTRAVENOUS | Status: DC

## 2016-10-09 MED ORDER — SUFENTANIL CITRATE 50 MCG/ML IV SOLN
INTRAVENOUS | Status: DC | PRN
Start: 2016-10-09 — End: 2016-10-09
  Administered 2016-10-09 (×2): 5 ug via INTRAVENOUS
  Administered 2016-10-09 (×5): 10 ug via INTRAVENOUS

## 2016-10-09 MED ORDER — LEVOTHYROXINE SODIUM 112 MCG PO TABS
112.0000 ug | ORAL_TABLET | ORAL | Status: DC
  Administered 2016-10-11 – 2016-10-12 (×2): 112 ug via ORAL
  Filled 2016-10-09: qty 1

## 2016-10-09 MED ORDER — PROPOFOL 10 MG/ML IV BOLUS
INTRAVENOUS | Status: AC
  Filled 2016-10-09: qty 40

## 2016-10-09 MED ORDER — ROCURONIUM BROMIDE 100 MG/10ML IV SOLN
INTRAVENOUS | Status: DC | PRN
  Administered 2016-10-09: 45 mg via INTRAVENOUS
  Administered 2016-10-09: 20 mg via INTRAVENOUS
  Administered 2016-10-09: 5 mg via INTRAVENOUS

## 2016-10-09 MED ORDER — ONDANSETRON HCL 4 MG PO TABS: 4.0000 mg | ORAL_TABLET | Freq: Four times a day (QID) | ORAL | Status: DC | PRN

## 2016-10-09 MED ORDER — SUFENTANIL CITRATE 50 MCG/ML IV SOLN
INTRAVENOUS | Status: AC
  Filled 2016-10-09: qty 1

## 2016-10-09 MED ORDER — BUPIVACAINE HCL (PF) 0.25 % IJ SOLN
INTRAMUSCULAR | Status: AC
  Filled 2016-10-09: qty 30

## 2016-10-09 MED ORDER — ENOXAPARIN SODIUM 40 MG/0.4ML ~~LOC~~ SOLN
40.0000 mg | SUBCUTANEOUS | Status: DC
  Administered 2016-10-10 – 2016-10-12 (×3): 40 mg via SUBCUTANEOUS
  Filled 2016-10-09 (×3): qty 0.4

## 2016-10-09 MED ORDER — DIPHENHYDRAMINE HCL 12.5 MG/5ML PO ELIX
12.5000 mg | ORAL_SOLUTION | Freq: Four times a day (QID) | ORAL | Status: DC | PRN
  Administered 2016-10-11: 12.5 mg via ORAL
  Filled 2016-10-09: qty 5

## 2016-10-09 MED ORDER — LIDOCAINE 2% (20 MG/ML) 5 ML SYRINGE
INTRAMUSCULAR | Status: AC
  Filled 2016-10-09: qty 5

## 2016-10-09 MED ORDER — DIPHENHYDRAMINE HCL 50 MG/ML IJ SOLN: 12.5000 mg | Freq: Four times a day (QID) | INTRAMUSCULAR | Status: DC | PRN

## 2016-10-09 MED ORDER — DEXTROSE 5 % IV SOLN
2.0000 g | INTRAVENOUS | Status: AC
  Administered 2016-10-09: 2 mg via INTRAVENOUS
  Filled 2016-10-09: qty 2

## 2016-10-09 MED ORDER — ALVIMOPAN 12 MG PO CAPS
12.0000 mg | ORAL_CAPSULE | Freq: Two times a day (BID) | ORAL | Status: DC
  Administered 2016-10-10 – 2016-10-12 (×4): 12 mg via ORAL
  Filled 2016-10-09 (×5): qty 1

## 2016-10-09 MED ORDER — HYDROMORPHONE HCL 2 MG/ML IJ SOLN
INTRAMUSCULAR | Status: AC
  Filled 2016-10-09: qty 1

## 2016-10-09 MED ORDER — ACETAMINOPHEN 500 MG PO TABS
1000.0000 mg | ORAL_TABLET | Freq: Four times a day (QID) | ORAL | Status: DC
  Administered 2016-10-09 – 2016-10-12 (×9): 1000 mg via ORAL
  Filled 2016-10-09 (×10): qty 2

## 2016-10-09 MED ORDER — MIDAZOLAM HCL 5 MG/5ML IJ SOLN
INTRAMUSCULAR | Status: DC | PRN
  Administered 2016-10-09: 2 mg via INTRAVENOUS

## 2016-10-09 MED ORDER — SUCCINYLCHOLINE CHLORIDE 20 MG/ML IJ SOLN
INTRAMUSCULAR | Status: DC | PRN
  Administered 2016-10-09: 120 mg via INTRAVENOUS

## 2016-10-09 MED ORDER — DEXTROSE 5 % IV SOLN
2.0000 g | Freq: Two times a day (BID) | INTRAVENOUS | Status: AC
  Administered 2016-10-09: 2 g via INTRAVENOUS
  Filled 2016-10-09: qty 2

## 2016-10-09 MED ORDER — STERILE WATER FOR INJECTION IJ SOLN
INTRAMUSCULAR | Status: AC
  Filled 2016-10-09: qty 10

## 2016-10-09 MED ORDER — LACTATED RINGERS IR SOLN
Status: DC | PRN
  Administered 2016-10-09: 1000 mL

## 2016-10-09 MED ORDER — STERILE WATER FOR IRRIGATION IR SOLN
Status: DC | PRN
  Administered 2016-10-09: 1000 mL

## 2016-10-09 MED ORDER — DEXAMETHASONE SODIUM PHOSPHATE 10 MG/ML IJ SOLN
INTRAMUSCULAR | Status: AC
  Filled 2016-10-09: qty 1

## 2016-10-09 MED ORDER — OXYCODONE HCL 5 MG PO TABS: 5.0000 mg | ORAL_TABLET | Freq: Once | ORAL | Status: DC | PRN

## 2016-10-09 MED ORDER — HYDROMORPHONE HCL 1 MG/ML IJ SOLN
INTRAMUSCULAR | Status: AC
  Filled 2016-10-09: qty 1

## 2016-10-09 MED ORDER — ROCURONIUM BROMIDE 50 MG/5ML IV SOSY
PREFILLED_SYRINGE | INTRAVENOUS | Status: AC
  Filled 2016-10-09: qty 5

## 2016-10-09 MED ORDER — SUCCINYLCHOLINE CHLORIDE 200 MG/10ML IV SOSY
PREFILLED_SYRINGE | INTRAVENOUS | Status: AC
  Filled 2016-10-09: qty 10

## 2016-10-09 MED ORDER — HYDROMORPHONE HCL 1 MG/ML IJ SOLN
INTRAMUSCULAR | Status: DC | PRN
  Administered 2016-10-09: 1 mg via INTRAVENOUS

## 2016-10-09 MED ORDER — METHYLENE BLUE 0.5 % INJ SOLN
INTRAVENOUS | Status: AC
  Filled 2016-10-09: qty 10

## 2016-10-09 MED ORDER — CEFOTETAN DISODIUM-DEXTROSE 2-2.08 GM-% IV SOLR
INTRAVENOUS | Status: AC
  Filled 2016-10-09: qty 50

## 2016-10-09 MED ORDER — LEVOTHYROXINE SODIUM 125 MCG PO TABS
125.0000 ug | ORAL_TABLET | ORAL | Status: DC
  Administered 2016-10-10: 125 ug via ORAL
  Filled 2016-10-09: qty 1

## 2016-10-09 MED ORDER — SACCHAROMYCES BOULARDII 250 MG PO CAPS
250.0000 mg | ORAL_CAPSULE | Freq: Two times a day (BID) | ORAL | Status: DC
  Administered 2016-10-09 – 2016-10-12 (×6): 250 mg via ORAL
  Filled 2016-10-09 (×6): qty 1

## 2016-10-09 MED ORDER — ONDANSETRON HCL 4 MG/2ML IJ SOLN
INTRAMUSCULAR | Status: DC | PRN
  Administered 2016-10-09: 4 mg via INTRAVENOUS

## 2016-10-09 MED ORDER — METHYLENE BLUE 0.5 % INJ SOLN
INTRAVENOUS | Status: DC | PRN
  Administered 2016-10-09: 5 mL

## 2016-10-09 MED ORDER — MORPHINE SULFATE (PF) 4 MG/ML IV SOLN: 2.0000 mg | INTRAVENOUS | Status: DC | PRN

## 2016-10-09 MED ORDER — SUGAMMADEX SODIUM 200 MG/2ML IV SOLN
INTRAVENOUS | Status: AC
  Filled 2016-10-09: qty 2

## 2016-10-09 MED ORDER — GLYCOPYRROLATE 0.2 MG/ML IJ SOLN
INTRAMUSCULAR | Status: DC | PRN
  Administered 2016-10-09: 0.2 mg via INTRAVENOUS

## 2016-10-09 SURGICAL SUPPLY — 98 items
BLADE EXTENDED COATED 6.5IN (ELECTRODE) IMPLANT
CANNULA REDUC XI 12-8 STAPL (CANNULA) ×2
CANNULA REDUCER 12-8 DVNC XI (CANNULA) ×4 IMPLANT
CATH FOLEY 3WAY  5CC 16FR (CATHETERS) ×1
CATH FOLEY 3WAY 5CC 16FR (CATHETERS) ×2 IMPLANT
CATH URET 5FR 28IN OPEN ENDED (CATHETERS) ×3 IMPLANT
CELLS DAT CNTRL 66122 CELL SVR (MISCELLANEOUS) IMPLANT
CLIP LIGATING HEM O LOK PURPLE (MISCELLANEOUS) IMPLANT
CLIP LIGATING HEMOLOK MED (MISCELLANEOUS) IMPLANT
COUNTER NEEDLE 20 DBL MAG RED (NEEDLE) ×3 IMPLANT
COVER MAYO STAND STRL (DRAPES) IMPLANT
COVER SURGICAL LIGHT HANDLE (MISCELLANEOUS) ×3 IMPLANT
COVER TIP SHEARS 8 DVNC (MISCELLANEOUS) ×2 IMPLANT
COVER TIP SHEARS 8MM DA VINCI (MISCELLANEOUS) ×1
DECANTER SPIKE VIAL GLASS SM (MISCELLANEOUS) ×3 IMPLANT
DERMABOND ADVANCED (GAUZE/BANDAGES/DRESSINGS) ×1
DERMABOND ADVANCED .7 DNX12 (GAUZE/BANDAGES/DRESSINGS) ×2 IMPLANT
DEVICE TROCAR PUNCTURE CLOSURE (ENDOMECHANICALS) ×3 IMPLANT
DRAIN CHANNEL 19F RND (DRAIN) IMPLANT
DRAPE ARM DVNC X/XI (DISPOSABLE) ×10 IMPLANT
DRAPE COLUMN DVNC XI (DISPOSABLE) ×2 IMPLANT
DRAPE DA VINCI XI ARM (DISPOSABLE) ×5
DRAPE DA VINCI XI COLUMN (DISPOSABLE) ×1
DRAPE SURG IRRIG POUCH 19X23 (DRAPES) ×3 IMPLANT
DRSG OPSITE POSTOP 4X10 (GAUZE/BANDAGES/DRESSINGS) IMPLANT
DRSG OPSITE POSTOP 4X6 (GAUZE/BANDAGES/DRESSINGS) ×3 IMPLANT
DRSG OPSITE POSTOP 4X8 (GAUZE/BANDAGES/DRESSINGS) IMPLANT
ELECT PENCIL ROCKER SW 15FT (MISCELLANEOUS) ×6 IMPLANT
ELECT REM PT RETURN 15FT ADLT (MISCELLANEOUS) ×3 IMPLANT
ENDOLOOP SUT PDS II  0 18 (SUTURE)
ENDOLOOP SUT PDS II 0 18 (SUTURE) IMPLANT
EVACUATOR SILICONE 100CC (DRAIN) IMPLANT
GAUZE SPONGE 4X4 12PLY STRL (GAUZE/BANDAGES/DRESSINGS) IMPLANT
GLOVE BIO SURGEON STRL SZ 6.5 (GLOVE) ×9 IMPLANT
GLOVE BIOGEL M STRL SZ7.5 (GLOVE) ×3 IMPLANT
GLOVE BIOGEL PI IND STRL 7.0 (GLOVE) ×12 IMPLANT
GLOVE BIOGEL PI INDICATOR 7.0 (GLOVE) ×6
GOWN STRL REUS W/ TWL XL LVL3 (GOWN DISPOSABLE) ×2 IMPLANT
GOWN STRL REUS W/TWL 2XL LVL3 (GOWN DISPOSABLE) ×9 IMPLANT
GOWN STRL REUS W/TWL XL LVL3 (GOWN DISPOSABLE) ×19 IMPLANT
GRASPER ENDOPATH ANVIL 10MM (MISCELLANEOUS) IMPLANT
HOLDER FOLEY CATH W/STRAP (MISCELLANEOUS) ×3 IMPLANT
IRRIG SUCT STRYKERFLOW 2 WTIP (MISCELLANEOUS) ×3
IRRIGATION SUCT STRKRFLW 2 WTP (MISCELLANEOUS) ×2 IMPLANT
KIT PROCEDURE DA VINCI SI (MISCELLANEOUS) ×2
KIT PROCEDURE DVNC SI (MISCELLANEOUS) ×4 IMPLANT
LEGGING LITHOTOMY PAIR STRL (DRAPES) ×3 IMPLANT
NEEDLE INSUFFLATION 14GA 120MM (NEEDLE) ×3 IMPLANT
PACK CARDIOVASCULAR III (CUSTOM PROCEDURE TRAY) ×3 IMPLANT
PACK COLON (CUSTOM PROCEDURE TRAY) ×3 IMPLANT
PACK CYSTO (CUSTOM PROCEDURE TRAY) ×3 IMPLANT
PLUG CATH AND CAP STER (CATHETERS) ×3 IMPLANT
PORT LAP GEL ALEXIS MED 5-9CM (MISCELLANEOUS) IMPLANT
RTRCTR WOUND ALEXIS 18CM MED (MISCELLANEOUS)
SCISSORS LAP 5X35 DISP (ENDOMECHANICALS) ×3 IMPLANT
SEAL CANN UNIV 5-8 DVNC XI (MISCELLANEOUS) ×6 IMPLANT
SEAL XI 5MM-8MM UNIVERSAL (MISCELLANEOUS) ×3
SLEEVE ADV FIXATION 5X100MM (TROCAR) IMPLANT
SOLUTION ELECTROLUBE (MISCELLANEOUS) ×3 IMPLANT
STAPLER 45 BLU RELOAD XI (STAPLE) IMPLANT
STAPLER 45 BLUE RELOAD XI (STAPLE)
STAPLER 45 GREEN RELOAD XI (STAPLE) ×1
STAPLER 45 GRN RELOAD XI (STAPLE) ×2 IMPLANT
STAPLER CANNULA SEAL DVNC XI (STAPLE) ×2 IMPLANT
STAPLER CANNULA SEAL XI (STAPLE) ×1
STAPLER CIRC ILS CVD 33MM 37CM (STAPLE) ×3 IMPLANT
STAPLER SHEATH (SHEATH) ×1
STAPLER SHEATH ENDOWRIST DVNC (SHEATH) ×2 IMPLANT
STAPLER VISISTAT 35W (STAPLE) ×3 IMPLANT
SUT ETHILON 2 0 PS N (SUTURE) IMPLANT
SUT NOVA NAB GS-21 0 18 T12 DT (SUTURE) ×6 IMPLANT
SUT PDS AB 1 CTX 36 (SUTURE) IMPLANT
SUT PDS AB 1 TP1 96 (SUTURE) IMPLANT
SUT PROLENE 2 0 KS (SUTURE) ×3 IMPLANT
SUT SILK 2 0 (SUTURE) ×1
SUT SILK 2 0 SH CR/8 (SUTURE) ×3 IMPLANT
SUT SILK 2-0 18XBRD TIE 12 (SUTURE) ×2 IMPLANT
SUT SILK 3 0 (SUTURE) ×1
SUT SILK 3 0 SH CR/8 (SUTURE) ×3 IMPLANT
SUT SILK 3-0 18XBRD TIE 12 (SUTURE) ×2 IMPLANT
SUT V-LOC BARB 180 2/0GR6 GS22 (SUTURE)
SUT VIC AB 2-0 SH 18 (SUTURE) ×3 IMPLANT
SUT VIC AB 2-0 SH 27 (SUTURE) ×1
SUT VIC AB 2-0 SH 27X BRD (SUTURE) ×2 IMPLANT
SUT VIC AB 3-0 SH 18 (SUTURE) IMPLANT
SUT VIC AB 4-0 PS2 27 (SUTURE) ×6 IMPLANT
SUT VICRYL 0 UR6 27IN ABS (SUTURE) ×3 IMPLANT
SUTURE V-LC BRB 180 2/0GR6GS22 (SUTURE) IMPLANT
SYR 10ML LL (SYRINGE) ×3 IMPLANT
SYS LAPSCP GELPORT 120MM (MISCELLANEOUS)
SYSTEM LAPSCP GELPORT 120MM (MISCELLANEOUS) IMPLANT
TOWEL OR 17X26 10 PK STRL BLUE (TOWEL DISPOSABLE) ×3 IMPLANT
TOWEL OR NON WOVEN STRL DISP B (DISPOSABLE) ×3 IMPLANT
TRAY FOLEY CATH 16FRSI W/METER (SET/KITS/TRAYS/PACK) ×3 IMPLANT
TRAY FOLEY W/METER SILVER 16FR (SET/KITS/TRAYS/PACK) ×3 IMPLANT
TROCAR ADV FIXATION 5X100MM (TROCAR) ×3 IMPLANT
TUBING CONNECTING 10 (TUBING) IMPLANT
TUBING INSUFFLATION 10FT LAP (TUBING) ×3 IMPLANT

## 2016-10-09 NOTE — Transfer of Care (Signed)
Immediate Anesthesia Transfer of Care Note  Patient: Jackie Snow  Procedure(s) Performed: Procedure(s): XI ROBOTIC ASSISTED SIGMOIDECTOMY (N/A) CYSTOSCOPY WITH FIREFLY INJECTION (Left)  Patient Location: PACU  Anesthesia Type:General  Level of Consciousness: awake, alert , oriented and patient cooperative  Airway & Oxygen Therapy: Patient Spontanous Breathing and Patient connected to face mask oxygen  Post-op Assessment: Report given to RN, Post -op Vital signs reviewed and stable and Patient moving all extremities  Post vital signs: stable  Last Vitals:  Vitals:   10/09/16 0630 10/09/16 1210  BP: 136/77 (!) 142/77  Pulse: 82 70  Resp: 18 11  Temp: 36.8 C     Last Pain:  Vitals:   10/09/16 0723  TempSrc:   PainSc: 0-No pain      Patients Stated Pain Goal: 3 (29/47/65 4650)  Complications: No apparent anesthesia complications

## 2016-10-09 NOTE — Op Note (Signed)
Preoperative diagnosis:  1. Colovesical fistula   Postoperative diagnosis:  1. same   Procedure: 1. Cystoscopy - injection of ICG dye 2. Insertion of temporary indwelling left ureteral stent  Surgeon: Ardis Hughs, MD  Anesthesia: General  Complications: None  Intraoperative findings: Apparent fistula in the posterior bladder wall on the left side.  EBL: Minimal  Specimens: None  Indication: Jackie Snow is a 71 y.o. patient with a colovesical fistula and is having a sigmoid colectomy with fistula take down by Dr. Marcello Moores.  She has requested that I place ICG dye and a temporary indwelling left sided ureteral stent.  After reviewing the management options for treatment, he elected to proceed with the above surgical procedure(s). We have discussed the potential benefits and risks of the procedure, side effects of the proposed treatment, the likelihood of the patient achieving the goals of the procedure, and any potential problems that might occur during the procedure or recuperation. Informed consent has been obtained.  Description of procedure:  The patient was taken to the operating room and general anesthesia was induced.  The patient was placed in the dorsal lithotomy position, prepped and draped in the usual sterile fashion, and preoperative antibiotics were administered. A preoperative time-out was performed.   A 30 deg 14F cystoscope was gentle passed into the patient's bladder under visual guidance.  Cystoscopy was performed with a fistula as described.  No other abnormalities noted.  I advanced a 55F open ended catheter through the scope and advanced it into the left ureter.  10cc of ICG dye was instilled.  The catheter was then advanced up the ureter to 25cm.  The scope was removed over the catheter.  A 3-way catheter was then placed into her bladder.  The ureteral catheter was then capped and secured to the foley with a silk tie.  The case was then turned over to Dr.  Marcello Moores for the remaining aspect of the scheduled procedure.  Ardis Hughs, M.D.

## 2016-10-09 NOTE — H&P (Signed)
Patient is referred by Dr. Louis Meckel for evaluation of colovesical fistula. Patient's primary care physician is Dr. Donald Prose. Patient presents with an approximately one-year history of signs and symptoms of colovesical fistula. She is noted bubbling and gurgling at the end of her urinary stream. She has had recurrent urinary tract infections. Patient underwent CT scan on April 16, 2016 showing moderate descending diverticulosis and severe sigmoid diverticulosis. There was evidence of a colovesical fistula from the sigmoid colon to the dome of the bladder. Previous abdominal surgery includes open appendectomy as a child and laparoscopic cholecystectomy approximately 10 years ago. Patient has not had any gynecologic surgery. Patient did have colonoscopy approximately 1-1/2 years ago, which showed several polyps but no signs of malignancy.    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 Omega 3 (1000MG  Capsule, Oral) Active. Garlic (1MG  Capsule, Oral) Active. Levothyroxine Sodium (125MCG Tablet, Oral) Active. (Monday and Thursday) Levothyroxine Sodium (112MCG Tablet, Oral) Active. (T, W, F, Sat, and Sunday) Medications Reconciled Neomycin Sulfate (500MG  Tablet, 2 (two) Tablet Oral SEE NOTE, Taken starting 07/01/2016) Active. (TAKE TWO TABLETS AT 2 PM, 3 PM, AND 10 PM  THE DAY PRIOR TO SURGERY) Flagyl (500MG  Tablet, 2 (two) Tablet Oral SEE NOTE, Taken starting 07/01/2016) Active. (Take at 2pm, 3pm, and 10pm the day prior to your colon operation)  Social History Leighton Ruff, MD; 8/36/6294 11:51 AM) Alcohol use  Occasional alcohol use. Caffeine use  Coffee, Tea. No drug use  Tobacco use  Former smoker.  Family History Leighton Ruff, MD; 7/65/4650 11:51 AM) Arthritis  Father, Mother, Sister. Breast Cancer  Daughter. Cancer  Daughter. Colon Polyps  Mother, Sister. Depression  Mother, Sister. Heart Disease  Brother, Father, Sister. Heart disease in female family member before age 97  Heart disease in female family member before age 21  Hypertension  Mother. Ischemic Bowel Disease  Mother. Thyroid problems  Mother.  Pregnancy / Birth History Leighton Ruff, MD; 3/54/6568 11:51 AM) Age at menarche  36 years. Age of menopause  26-60 Contraceptive History  Oral contraceptives. Gravida  3 Length (months) of breastfeeding  3-6 Maternal age  34-25 Para  12  Other Problems Leighton Ruff, MD; 06/29/5168 11:51 AM) Arthritis  Bladder Problems  Cancer  Cholelithiasis  Diverticulosis  Hemorrhoids  Hypercholesterolemia  Thyroid Disease     Review of Systems  General Not Present- Appetite Loss, Chills, Fatigue, Fever, Night Sweats, Weight Gain and Weight Loss. Skin Present- Dryness. Not Present- Change in Wart/Mole, Hives, Jaundice, New Lesions, Non-Healing Wounds, Rash and Ulcer. HEENT Not Present- Earache, Hearing Loss, Hoarseness, Nose Bleed, Oral Ulcers, Ringing in the Ears, Seasonal Allergies, Sinus Pain, Sore Throat, Visual Disturbances, Wears glasses/contact lenses and Yellow Eyes. Respiratory Not Present- Bloody sputum, Chronic Cough, Difficulty Breathing, Snoring and Wheezing. Breast Not Present- Breast Mass, Breast Pain, Nipple Discharge and Skin Changes. Cardiovascular Not Present- Chest Pain, Difficulty  Breathing Lying Down, Leg Cramps, Palpitations, Rapid Heart Rate, Shortness of Breath  and Swelling of Extremities. Gastrointestinal Present- Bloating. Not Present- Abdominal Pain, Bloody Stool, Change in Bowel Habits, Chronic diarrhea, Constipation, Difficulty Swallowing, Excessive gas, Gets full quickly at meals, Hemorrhoids, Indigestion, Nausea, Rectal Pain and Vomiting. Female Genitourinary Present- Frequency, Nocturia and Urgency. Not Present- Painful Urination and Pelvic Pain. Musculoskeletal Present- Joint Pain. Not Present- Back Pain, Joint Stiffness, Muscle Pain, Muscle Weakness and Swelling of Extremities. Neurological Not Present- Decreased Memory, Fainting, Headaches, Numbness, Seizures, Tingling, Tremor, Trouble walking and Weakness. Psychiatric Not Present- Anxiety, Bipolar, Change in Sleep Pattern, Depression, Fearful and Frequent crying. Hematology Not Present- Blood Thinners, Easy Bruising, Excessive bleeding, Gland problems, HIV and Persistent Infections. BP 136/77   Pulse 82   Temp 98.3 F (36.8 C) (Oral)   Resp 18   Ht 5' 5.5" (1.664 m)   Wt 88 kg (194 lb)   SpO2 98%   BMI 31.79 kg/m     Physical Exam  The physical exam findings are as follows: Note:General - appears comfortable, no distress; not diaphorectic  HEENT - normocephalic; sclerae clear, gaze conjugate; mucous membranes moist, dentition good; voice normal  Neck - symmetric on extension; no palpable anterior or posterior cervical adenopathy  Chest - clear bilaterally without rhonchi, rales, or wheeze  Cor - regular rhythm with normal rate; no significant murmur  Abd - soft without distension; well-healed surgical incision; no sign of herniation; mild tenderness to deep palpation left lower quadrant without palpable mass  Ext - non-tender without significant edema or lymphedema  Neuro - grossly intact; no tremor    Assessment & Plan  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 She has discussed an open resection with Dr. Harlow Asa. She is here today to discuss possible robotic resection with me. We have discussed this in detail including the pros and cons of robotic versus laparoscopic versus open surgery. She has elected to proceed with a robotic resection. I will obtain the CT scan that was done at [facility] to evaluate my surgical approach. We have discussed the possibility of needing a bladder repair or ureteral identification by Dr Louis Meckel. The surgery and anatomy were described to the patient as well as the risks of surgery and the possible complications. These include: Bleeding, deep abdominal infections and possible wound complications such as hernia and infection, damage to adjacent structures, leak of surgical connections, which can lead to other surgeries and possibly an ostomy, possible need for other procedures, such as abscess drains in radiology, possible prolonged hospital stay, possible diarrhea from removal of part of the colon, possible constipation from narcotics, possible bowel, bladder or sexual dysfunction if having rectal surgery, prolonged fatigue/weakness or appetite loss, possible early recurrence of of disease, possible complications of their medical problems such as heart disease or arrhythmias or lung problems, death (less than 1%). I believe the patient understands and wishes to proceed with the surgery.

## 2016-10-09 NOTE — Op Note (Signed)
10/09/2016  12:00 PM  PATIENT:  Jackie Snow  70 y.o. female  Patient Care Team: Donald Prose, MD as PCP - General (Family Medicine)  PRE-OPERATIVE DIAGNOSIS:  colovesicular fistula  POST-OPERATIVE DIAGNOSIS:  colovesicular fistula  PROCEDURE:   XI ROBOTIC ASSISTED SIGMOIDECTOMY CYSTOSCOPY WITH FIREFLY INJECTION    Surgeon(s): Michael Boston, MD Ardis Hughs, MD Leighton Ruff, MD  ASSISTANT: Dr Johney Maine   ANESTHESIA:   local and general  EBL:321ml  Total I/O In: 1000 [I.V.:1000] Out: 330 [Urine:30; Blood:300]  Delay start of Pharmacological VTE agent (>24hrs) due to surgical blood loss or risk of bleeding:  no  DRAINS: none   SPECIMEN:  Source of Specimen:  Sigmoid colon  DISPOSITION OF SPECIMEN:  PATHOLOGY  COUNTS:  YES  PLAN OF CARE: Admit to inpatient   PATIENT DISPOSITION:  PACU - hemodynamically stable.  INDICATION:    71 y.o. F with signs of colovesical fistula on cystoscopy.  CT shows severe diverticular disease.  I recommended segmental resection:  The anatomy & physiology of the digestive tract was discussed.  The pathophysiology was discussed.  Natural history risks without surgery was discussed.   I worked to give an overview of the disease and the frequent need to have multispecialty involvement.  I feel the risks of no intervention will lead to serious problems that outweigh the operative risks; therefore, I recommended a partial colectomy to remove the pathology.  Laparoscopic & open techniques were discussed.   Risks such as bleeding, infection, abscess, leak, reoperation, possible ostomy, hernia, heart attack, death, and other risks were discussed.  I noted a good likelihood this will help address the problem.   Goals of post-operative recovery were discussed as well.    The patient expressed understanding & wished to proceed with surgery.  OR FINDINGS:   Patient had a long segment of diverticular stricture.  Adherent to bladder.  No bladder  leak when tested with 541ml saline.    The anastomosis rests 13 cm from the anal verge by rigid proctoscopy.  DESCRIPTION:   Informed consent was confirmed.  The patient underwent general anaesthesia without difficulty.  The patient was positioned appropriately.  VTE prevention in place.  The patient's abdomen was clipped, prepped, & draped in a sterile fashion.  Surgical timeout confirmed our plan.  The patient was positioned in reverse Trendelenburg.  Abdominal entry was gained using a Varies needle in the LUQ.  Entry was clean.  I induced carbon dioxide insufflation.  An 8 mm robotic port was placed.  Camera inspection revealed no injury.  Extra ports were carefully placed under direct laparoscopic visualization.   I reflected the greater omentum and the upper abdomen the small bowel in the upper abdomen.  The robot was then docked to the patient's left side.  Instruments were placed under direct visualization.  I divided the sidewall attachments sharply to allow for retraction of the sigmoid colon up and out of the pelvis.  I scored the base of peritoneum of the right side of the mesentery of the left colon from the ligament of Treitz to the peritoneal reflection of the mid rectum.   I elevated the sigmoid mesentery and enetered into the retro-mesenteric plane. We were able to identify the left ureter and gonadal vessels. We kept those posterior within the retroperitoneum and elevated the left colon mesentery off that. I did isolated IMA pedicle but did not ligate it yet.  I continued distally and got into the avascular plane posterior to the  mesorectum. This allowed me to help mobilize the rectum as well by freeing the mesorectum off the sacrum.  I mobilized the peritoneal coverings towards the peritoneal reflection on both the right and left sides of the rectum.  I could see the right and left ureters and stayed away from them.    I skeletonized the inferior mesenteric artery pedicle.  I isolated  the inferior mesenteric vein off of the ligament of Treitz just cephalad to that as well.  After confirming the left ureter was out of the way, I went ahead and ligated the inferior mesenteric artery pedicle with bipolar robotic vessel sealer ~2cm above its takeoff from the aorta.   We ensured hemostasis. I skeletonized the mesorectum at the junction at the proximal rectum using blunt dissection & bipolar EnSeal.  I mobilized the left colon in a lateral to medial fashion off the line of Toldt up towards the splenic flexure to ensure good mobilization of the left colon to reach into the pelvis.  I divided the remaining mesentery with the vessel sealer.  I identified the rectosigmoid junction and skeletonized the mesorectum at this juncture.  I divided the rectum with a green load robotic stapler.  The bladder was then insufflated with 590ml of saline.  There were no leaks presents.  The fistula site was clean.    At this point we made a Pfannenstiel incision and remove the diseased portion of the sigmoid colon. I divided the junction of the descending and sigmoid colon using electrocautery. A pursestring device was used to place a 2-0 Prolene pursestring suture around this. A 33 mm EEA anvil was then placed into the colon and the pursestring was tied tightly around this. This was then placed back into the abdomen. An anastomosis was created using the EEA stapler. There was no tension on the anastomosis. There was no leak when tested with insufflation under water. The abdomen was irrigated. Hemostasis was good. The 12 mm port was closed E using a 0 Vicryl suture and endoscopic suture passer.  The omentum was then brought down over the small bowel and the abdomen was desufflated. The ports were removed. We switched to clean gowns, gloves, instruments and drapes.  The peritoneum was closed using a running 2-0 Vicryl suture. The fascia of the Pfannenstiel incision was closed using interrupted 0 Novafil sutures. The  subcutaneous continues tissues reapproximated using interrupted 2-0 Vicryl sutures. The skin was closed using a running 4-0 Vicryl suture. A dressing was applied over this. The remaining port sites were closed using a 4-0 Vicryl suture and Dermabond. The patient tolerated procedure well and was awakened and sent to the postanesthesia care unit in stable condition. All counts were correct per operating room staff.

## 2016-10-09 NOTE — Anesthesia Preprocedure Evaluation (Signed)
Anesthesia Evaluation  Patient identified by MRN, date of birth, ID band Patient awake    Reviewed: Allergy & Precautions, NPO status , Patient's Chart, lab work & pertinent test results  Airway Mallampati: II  TM Distance: >3 FB Neck ROM: Full    Dental no notable dental hx.    Pulmonary neg pulmonary ROS, former smoker,    Pulmonary exam normal breath sounds clear to auscultation       Cardiovascular negative cardio ROS Normal cardiovascular exam Rhythm:Regular Rate:Normal     Neuro/Psych negative neurological ROS  negative psych ROS   GI/Hepatic negative GI ROS, Neg liver ROS, GERD  ,  Endo/Other  negative endocrine ROSHypothyroidism   Renal/GU negative Renal ROS  negative genitourinary   Musculoskeletal negative musculoskeletal ROS (+) Arthritis ,   Abdominal   Peds negative pediatric ROS (+)  Hematology negative hematology ROS (+) anemia ,   Anesthesia Other Findings   Reproductive/Obstetrics negative OB ROS                             Anesthesia Physical Anesthesia Plan  ASA: III  Anesthesia Plan: General   Post-op Pain Management:    Induction: Intravenous  Airway Management Planned: Oral ETT  Additional Equipment:   Intra-op Plan:   Post-operative Plan: Extubation in OR  Informed Consent: I have reviewed the patients History and Physical, chart, labs and discussed the procedure including the risks, benefits and alternatives for the proposed anesthesia with the patient or authorized representative who has indicated his/her understanding and acceptance.   Dental advisory given  Plan Discussed with: CRNA  Anesthesia Plan Comments:         Anesthesia Quick Evaluation

## 2016-10-09 NOTE — Anesthesia Postprocedure Evaluation (Signed)
Anesthesia Post Note  Patient: Jackie Snow  Procedure(s) Performed: Procedure(s) (LRB): XI ROBOTIC ASSISTED SIGMOIDECTOMY (N/A) CYSTOSCOPY WITH FIREFLY INJECTION (Left)  Patient location during evaluation: PACU Anesthesia Type: General Level of consciousness: awake and alert Pain management: pain level controlled Vital Signs Assessment: post-procedure vital signs reviewed and stable Respiratory status: spontaneous breathing, nonlabored ventilation and respiratory function stable Cardiovascular status: blood pressure returned to baseline and stable Postop Assessment: no signs of nausea or vomiting Anesthetic complications: no       Last Vitals:  Vitals:   10/09/16 1315 10/09/16 1330  BP: 137/83 125/61  Pulse: 64 63  Resp: 13 14  Temp: 36.6 C 36.5 C    Last Pain:  Vitals:   10/09/16 1330  TempSrc: Oral  PainSc: 3                  Lynda Rainwater

## 2016-10-09 NOTE — Anesthesia Procedure Notes (Signed)
Procedure Name: Intubation Date/Time: 10/09/2016 8:46 AM Performed by: Lissa Morales Pre-anesthesia Checklist: Patient identified, Emergency Drugs available, Suction available and Patient being monitored Patient Re-evaluated:Patient Re-evaluated prior to inductionOxygen Delivery Method: Circle system utilized Preoxygenation: Pre-oxygenation with 100% oxygen Intubation Type: IV induction Ventilation: Mask ventilation without difficulty Laryngoscope Size: Mac and 4 Grade View: Grade I Tube type: Oral Tube size: 7.5 mm Number of attempts: 1 Airway Equipment and Method: Stylet and Oral airway Placement Confirmation: ETT inserted through vocal cords under direct vision,  positive ETCO2 and breath sounds checked- equal and bilateral Secured at: 21 cm Tube secured with: Tape Dental Injury: Teeth and Oropharynx as per pre-operative assessment

## 2016-10-10 ENCOUNTER — Encounter (HOSPITAL_COMMUNITY): Payer: Self-pay | Admitting: General Surgery

## 2016-10-10 LAB — HEMOGLOBIN A1C
HEMOGLOBIN A1C: 5.8 % — AB (ref 4.8–5.6)
Mean Plasma Glucose: 120 mg/dL

## 2016-10-10 LAB — CBC
HCT: 35.6 % — ABNORMAL LOW (ref 36.0–46.0)
Hemoglobin: 11.7 g/dL — ABNORMAL LOW (ref 12.0–15.0)
MCH: 29.8 pg (ref 26.0–34.0)
MCHC: 32.9 g/dL (ref 30.0–36.0)
MCV: 90.6 fL (ref 78.0–100.0)
PLATELETS: 221 10*3/uL (ref 150–400)
RBC: 3.93 MIL/uL (ref 3.87–5.11)
RDW: 14.3 % (ref 11.5–15.5)
WBC: 12.2 10*3/uL — AB (ref 4.0–10.5)

## 2016-10-10 LAB — TYPE AND SCREEN
ABO/RH(D): A POS
Antibody Screen: NEGATIVE

## 2016-10-10 LAB — BASIC METABOLIC PANEL
Anion gap: 6 (ref 5–15)
BUN: 9 mg/dL (ref 6–20)
CO2: 29 mmol/L (ref 22–32)
CREATININE: 0.71 mg/dL (ref 0.44–1.00)
Calcium: 8.7 mg/dL — ABNORMAL LOW (ref 8.9–10.3)
Chloride: 105 mmol/L (ref 101–111)
GFR calc Af Amer: >60 mL/min (ref >60)
Glucose, Bld: 133 mg/dL — ABNORMAL HIGH (ref 65–99)
Potassium: 4 mmol/L (ref 3.5–5.1)
SODIUM: 140 mmol/L (ref 135–145)

## 2016-10-10 NOTE — Progress Notes (Signed)
1 Day Post-Op robotic sigmoidectomy Subjective: Doing well.  Min pain, no nausea  Objective: Vital signs in last 24 hours: Temp:  [97.5 F (36.4 C)-99.4 F (37.4 C)] 97.8 F (36.6 C) (05/10 0520) Pulse Rate:  [56-71] 64 (05/10 0520) Resp:  [10-16] 16 (05/10 0520) BP: (111-142)/(41-83) 123/41 (05/10 0520) SpO2:  [99 %-100 %] 100 % (05/10 0520) Weight:  [88 kg (194 lb)] 88 kg (194 lb) (05/09 1330)   Intake/Output from previous day: 05/09 0701 - 05/10 0700 In: 3950 [I.V.:3950] Out: 1715 [Urine:1415; Blood:300] Intake/Output this shift: No intake/output data recorded.   General appearance: alert and cooperative GI: normal findings: soft, non-tender  Incision: no significant drainage  Lab Results:   Recent Labs  10/10/16 0426  WBC 12.2*  HGB 11.7*  HCT 35.6*  PLT 221   BMET  Recent Labs  10/10/16 0426  NA 140  K 4.0  CL 105  CO2 29  GLUCOSE 133*  BUN 9  CREATININE 0.71  CALCIUM 8.7*   PT/INR No results for input(s): LABPROT, INR in the last 72 hours. ABG No results for input(s): PHART, HCO3 in the last 72 hours.  Invalid input(s): PCO2, PO2  MEDS, Scheduled . acetaminophen  1,000 mg Oral Q6H  . alvimopan  12 mg Oral BID  . enoxaparin (LOVENOX) injection  40 mg Subcutaneous Q24H  . [START ON 10/11/2016] levothyroxine  112 mcg Oral Once per day on Sun Tue Wed Fri Sat  . levothyroxine  125 mcg Oral Once per day on Mon Thu  . saccharomyces boulardii  250 mg Oral BID    Studies/Results: No results found.  Assessment: s/p Procedure(s): XI ROBOTIC ASSISTED SIGMOIDECTOMY CYSTOSCOPY WITH FIREFLY INJECTION Patient Active Problem List   Diagnosis Date Noted  . 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

## 2016-10-10 NOTE — Evaluation (Signed)
Physical Therapy Evaluation Patient Details Name: Jackie Snow MRN: [id number] DOB: 1946-04-09 Today's Date: 10/10/2016   History of Present Illness  Pt is a 71 year old female s/p robotic sigmoidectomy due to colovesicular fistula  Clinical Impression  Pt admitted with above diagnosis. Pt currently with functional limitations due to the deficits listed below (see PT Problem List).  Pt will benefit from skilled PT to increase their independence and safety with mobility to allow discharge to the venue listed below.  Pt mobilizing well and reports her family has planned to stay with her 24/7 for a week upon d/c.  Pt will likely progress well with no f/u recommendations.  Pt would like PT to return and go over steps prior to d/c.  Pt encouraged to ambulate at least 3x/day, to which she is agreeable.     Follow Up Recommendations No PT follow up    Equipment Recommendations  None recommended by PT    Recommendations for Other Services       Precautions / Restrictions Precautions Precautions: None      Mobility  Bed Mobility Overal bed mobility: Needs Assistance Bed Mobility: Supine to Sit     Supine to sit: HOB elevated;Min assist     General bed mobility comments: pt requested HOB elevated for pain control today, assist for trunk  Transfers Overall transfer level: Needs assistance Equipment used: None Transfers: Sit to/from Stand Sit to Stand: Min guard         General transfer comment: min/guard for safety  Ambulation/Gait Ambulation/Gait assistance: Min guard;Supervision Ambulation Distance (Feet): 400 Feet Assistive device: None Gait Pattern/deviations: Step-through pattern;Decreased stride length     General Gait Details: pt pushed IV pole and grazed hand rail, slightly unsteadiness without UE support however no LOB  Stairs            Wheelchair Mobility    Modified Rankin (Stroke Patients Only)       Balance                                              Pertinent Vitals/Pain Pain Assessment: 0-10 Pain Score: 5  Pain Location: surgical site Pain Descriptors / Indicators: Sore Pain Intervention(s): Limited activity within patient's tolerance;Repositioned;Monitored during session    Home Living Family/patient expects to be discharged to:: Private residence Living Arrangements: Alone Available Help at Discharge: Family;Available 24 hours/day Type of Home: House Home Access: Stairs to enter Entrance Stairs-Rails: None Entrance Stairs-Number of Steps: one and one Home Layout: One level Home Equipment: None      Prior Function Level of Independence: Independent               Hand Dominance        Extremity/Trunk Assessment        Lower Extremity Assessment Lower Extremity Assessment: Overall WFL for tasks assessed       Communication   Communication: No difficulties  Cognition Arousal/Alertness: Awake/alert Behavior During Therapy: WFL for tasks assessed/performed Overall Cognitive Status: Within Functional Limits for tasks assessed                                        General Comments      Exercises     Assessment/Plan    PT Assessment Patient  needs continued PT services  PT Problem List Decreased mobility;Decreased activity tolerance;Pain       PT Treatment Interventions Gait training;DME instruction;Therapeutic activities;Therapeutic exercise;Stair training;Functional mobility training;Patient/family education    PT Goals (Current goals can be found in the Care Plan section)  Acute Rehab PT Goals PT Goal Formulation: With patient Time For Goal Achievement: 10/17/16 Potential to Achieve Goals: Good    Frequency Min 3X/week   Barriers to discharge        Co-evaluation               AM-PAC PT "6 Clicks" Daily Activity  Outcome Measure Difficulty turning over in bed (including adjusting bedclothes, sheets and blankets)?: None Difficulty  moving from lying on back to sitting on the side of the bed? : A Little Difficulty sitting down on and standing up from a chair with arms (e.g., wheelchair, bedside commode, etc,.)?: A Little Help needed moving to and from a bed to chair (including a wheelchair)?: A Little Help needed walking in hospital room?: A Little Help needed climbing 3-5 steps with a railing? : A Little 6 Click Score: 19    End of Session   Activity Tolerance: Patient tolerated treatment well Patient left: in chair;with call bell/phone within reach Nurse Communication: Mobility status PT Visit Diagnosis: Difficulty in walking, not elsewhere classified (R26.2)    Time: 0950-1007 PT Time Calculation (min) (ACUTE ONLY): 17 min   Charges:   PT Evaluation $PT Eval Low Complexity: 1 Procedure     PT G CodesCarmelia Bake, PT, DPT 10/10/2016 Pager: [phone]   York Ram E 10/10/2016, 10:39 AM

## 2016-10-11 LAB — BASIC METABOLIC PANEL
ANION GAP: 4 — AB (ref 5–15)
BUN: 7 mg/dL (ref 6–20)
CO2: 30 mmol/L (ref 22–32)
Calcium: 8.6 mg/dL — ABNORMAL LOW (ref 8.9–10.3)
Chloride: 105 mmol/L (ref 101–111)
Creatinine, Ser: 0.68 mg/dL (ref 0.44–1.00)
GFR calc Af Amer: >60 mL/min (ref >60)
GLUCOSE: 101 mg/dL — AB (ref 65–99)
Potassium: 3.8 mmol/L (ref 3.5–5.1)
Sodium: 139 mmol/L (ref 135–145)

## 2016-10-11 LAB — CBC
HCT: 35.4 % — ABNORMAL LOW (ref 36.0–46.0)
HEMOGLOBIN: 11.2 g/dL — AB (ref 12.0–15.0)
MCH: 29.3 pg (ref 26.0–34.0)
MCHC: 31.6 g/dL (ref 30.0–36.0)
MCV: 92.7 fL (ref 78.0–100.0)
Platelets: 214 10*3/uL (ref 150–400)
RBC: 3.82 MIL/uL — ABNORMAL LOW (ref 3.87–5.11)
RDW: 14.8 % (ref 11.5–15.5)
WBC: 7.7 10*3/uL (ref 4.0–10.5)

## 2016-10-11 MED ORDER — SODIUM CHLORIDE 0.9% FLUSH: 3.0000 mL | INTRAVENOUS | Status: DC | PRN

## 2016-10-11 MED ORDER — TRAMADOL HCL 50 MG PO TABS
50.0000 mg | ORAL_TABLET | Freq: Four times a day (QID) | ORAL | Status: DC | PRN
  Administered 2016-10-11: 50 mg via ORAL
  Filled 2016-10-11: qty 1

## 2016-10-11 MED ORDER — SODIUM CHLORIDE 0.9% FLUSH
3.0000 mL | Freq: Two times a day (BID) | INTRAVENOUS | Status: DC
  Administered 2016-10-11: 3 mL via INTRAVENOUS

## 2016-10-11 NOTE — Progress Notes (Signed)
Physical Therapy Treatment and Discharge Patient Details Name: Jackie Snow MRN: [id number] DOB: 1945/08/21 Today's Date: 10/11/2016    History of Present Illness Pt is a 71 year old female s/p robotic sigmoidectomy due to colovesicular fistula    PT Comments    Pt mobilizing well and practiced safe stair technique.  Pt has met acute PT goals and plans to continue ambulating during acute stay with staff or her daughter.  Pt has RW at home.  Pt agreeable to no further acute PT needs at this time.   Follow Up Recommendations  No PT follow up     Equipment Recommendations  None recommended by PT    Recommendations for Other Services       Precautions / Restrictions Precautions Precautions: None    Mobility  Bed Mobility               General bed mobility comments: pt up in recliner on arrival  Transfers Overall transfer level: Modified independent Equipment used: None Transfers: Sit to/from Stand Sit to Stand: Modified independent (Device/Increase time)         General transfer comment: increased time  Ambulation/Gait Ambulation/Gait assistance: Modified independent (Device/Increase time);Supervision Ambulation Distance (Feet): 240 Feet Assistive device: None Gait Pattern/deviations: Step-through pattern;Decreased stride length     General Gait Details: slow but steady gait, pt using hand rail for more support at times   Stairs Stairs: Yes   Stair Management: Step to pattern;Forwards;One rail Right Number of Stairs: 4 General stair comments: verbal cues for sequence and safety  Wheelchair Mobility    Modified Rankin (Stroke Patients Only)       Balance                                            Cognition Arousal/Alertness: Awake/alert Behavior During Therapy: WFL for tasks assessed/performed Overall Cognitive Status: Within Functional Limits for tasks assessed                                         Exercises      General Comments        Pertinent Vitals/Pain Pain Assessment: 0-10 Pain Score: 2  Pain Location: surgical site Pain Descriptors / Indicators: Sore Pain Intervention(s): Limited activity within patient's tolerance;Monitored during session;Repositioned    Home Living                      Prior Function            PT Goals (current goals can now be found in the care plan section) Progress towards PT goals: Goals met/education completed, patient discharged from PT    Frequency           PT Plan Other (comment) (met goals, d/c from acute PT)    Co-evaluation              AM-PAC PT "6 Clicks" Daily Activity  Outcome Measure  Difficulty turning over in bed (including adjusting bedclothes, sheets and blankets)?: None Difficulty moving from lying on back to sitting on the side of the bed? : A Little Difficulty sitting down on and standing up from a chair with arms (e.g., wheelchair, bedside commode, etc,.)?: A Little Help needed moving to and from a bed to  chair (including a wheelchair)?: A Little Help needed walking in hospital room?: A Little Help needed climbing 3-5 steps with a railing? : A Little 6 Click Score: 19    End of Session   Activity Tolerance: Patient tolerated treatment well Patient left: in chair;with call bell/phone within reach   PT Visit Diagnosis: Difficulty in walking, not elsewhere classified (R26.2)     Time: 0813-8871 PT Time Calculation (min) (ACUTE ONLY): 9 min  Charges:  $Gait Training: 8-22 mins                    G Codes:       Carmelia Bake, PT, DPT 10/11/2016 Pager: [phone]  York Ram E 10/11/2016, 12:52 PM

## 2016-10-11 NOTE — Progress Notes (Signed)
2 Days Post-Op robotic sigmoidectomy Subjective: Doing well. Pain controlled with tylenol, no nausea, tolerating liquid, having bowel function  Objective: Vital signs in last 24 hours: Temp:  [97.3 F (36.3 C)-98.6 F (37 C)] 98.6 F (37 C) (05/11 0542) Pulse Rate:  [66-74] 73 (05/11 0542) Resp:  [15-17] 17 (05/11 0542) BP: (117-136)/(56-80) 136/70 (05/11 0542) SpO2:  [94 %-100 %] 97 % (05/11 0542)   Intake/Output from previous day: 05/10 0701 - 05/11 0700 In: 1796.7 [P.O.:480; I.V.:1316.7] Out: 3300 [Urine:3300] Intake/Output this shift: No intake/output data recorded.   General appearance: alert and cooperative GI: normal findings: soft, non-tender  Incision: no significant drainage  Lab Results:   Recent Labs  10/10/16 0426 10/11/16 0429  WBC 12.2* 7.7  HGB 11.7* 11.2*  HCT 35.6* 35.4*  PLT 221 214   BMET  Recent Labs  10/10/16 0426 10/11/16 0429  NA 140 139  K 4.0 3.8  CL 105 105  CO2 29 30  GLUCOSE 133* 101*  BUN 9 7  CREATININE 0.71 0.68  CALCIUM 8.7* 8.6*   PT/INR No results for input(s): LABPROT, INR in the last 72 hours. ABG No results for input(s): PHART, HCO3 in the last 72 hours.  Invalid input(s): PCO2, PO2  MEDS, Scheduled . acetaminophen  1,000 mg Oral Q6H  . alvimopan  12 mg Oral BID  . enoxaparin (LOVENOX) injection  40 mg Subcutaneous Q24H  . levothyroxine  112 mcg Oral Once per day on Sun Tue Wed Fri Sat  . levothyroxine  125 mcg Oral Once per day on Mon Thu  . saccharomyces boulardii  250 mg Oral BID    Studies/Results: No results found.  Assessment: s/p Procedure(s): XI ROBOTIC ASSISTED SIGMOIDECTOMY CYSTOSCOPY WITH FIREFLY INJECTION Patient Active Problem List   Diagnosis Date Noted  . Colovaginal fistula 10/09/2016    Expected post op course  Plan: D/C foley Ambulate SL IVF's Soft diet PO pain meds Anticipate d/c tom     LOS: 2 days     .Rosario Adie, [facility],  [place]   10/11/2016 8:35 AM

## 2016-10-11 NOTE — Discharge Instructions (Signed)

## 2016-10-12 LAB — BASIC METABOLIC PANEL
Anion gap: 8 (ref 5–15)
BUN: 9 mg/dL (ref 6–20)
CALCIUM: 8.8 mg/dL — AB (ref 8.9–10.3)
CO2: 27 mmol/L (ref 22–32)
CREATININE: 0.61 mg/dL (ref 0.44–1.00)
Chloride: 103 mmol/L (ref 101–111)
GFR calc Af Amer: >60 mL/min (ref >60)
GLUCOSE: 99 mg/dL (ref 65–99)
Potassium: 3.8 mmol/L (ref 3.5–5.1)
SODIUM: 138 mmol/L (ref 135–145)

## 2016-10-12 LAB — CBC
HEMATOCRIT: 35.5 % — AB (ref 36.0–46.0)
Hemoglobin: 11.3 g/dL — ABNORMAL LOW (ref 12.0–15.0)
MCH: 29.4 pg (ref 26.0–34.0)
MCHC: 31.8 g/dL (ref 30.0–36.0)
MCV: 92.2 fL (ref 78.0–100.0)
PLATELETS: 218 10*3/uL (ref 150–400)
RBC: 3.85 MIL/uL — ABNORMAL LOW (ref 3.87–5.11)
RDW: 14.5 % (ref 11.5–15.5)
WBC: 7 10*3/uL (ref 4.0–10.5)

## 2016-10-12 MED ORDER — HYDROCODONE-ACETAMINOPHEN 5-325 MG PO TABS: 1.0000 | ORAL_TABLET | Freq: Four times a day (QID) | ORAL | 0 refills | Status: DC | PRN

## 2016-10-12 NOTE — Discharge Summary (Signed)
Physician Discharge Summary  Patient ID: Jackie Snow MRN: [id number] DOB/AGE: April 03, 1946 71 y.o.  Admit date: 10/09/2016 Discharge date: 10/12/2016  Admission Diagnoses:  Colovaginal fistula  Discharge Diagnoses:  same  Active Problems:   Colovaginal fistula   Surgery:  Robotic XI assisted sigmoid colectomy  Discharged Condition: improved  Hospital Course:   Had surgery.  Flatus and po intake without problems.  Diet advanced and ready to go home Saturday morning.    Consults: none   Significant Diagnostic Studies: none    Discharge Exam: Blood pressure (!) 132/57, pulse 66, temperature 98.2 F (36.8 C), temperature source Oral, resp. rate 18, height 5' 5.5" (1.664 m), weight 88 kg (194 lb), SpO2 97 %. Incisions healing OK.  Will take off honeycomb transverse dressing prior to discharge  Disposition: 01-Home or Self Care  Discharge Instructions    Call MD for:  redness, tenderness, or signs of infection (pain, swelling, redness, odor or green/yellow discharge around incision site)    Complete by:  As directed    Call MD for:  temperature >100.4    Complete by:  As directed    Diet - low sodium heart healthy    Complete by:  As directed    Discharge instructions    Complete by:  As directed    Shower ad lib Drive when not taking pain meds and feel strong enough to drive   Increase activity slowly    Complete by:  As directed      Allergies as of 10/12/2016   No Known Allergies     Medication List    STOP taking these medications   predniSONE 20 MG tablet Commonly known as:  DELTASONE     TAKE these medications   HYDROcodone-acetaminophen 5-325 MG tablet Commonly known as:  NORCO/VICODIN Take 1-2 tablets by mouth every 6 (six) hours as needed for moderate pain. What changed:  when to take this  reasons to take this   levothyroxine 112 MCG tablet Commonly known as:  SYNTHROID, LEVOTHROID Take 112 mcg by mouth See admin instructions. Takes 112 mcg  5  days week day and 125 mcg on Mondays and Thursdays   levothyroxine 125 MCG tablet Commonly known as:  SYNTHROID, LEVOTHROID Take 125 mcg by mouth 2 (two) times a week. Takes 125 mcg on Monday and Thursdays  And 112 mcg all other days of the week   MOTRIN PM 200-38 MG Tabs Generic drug:  Ibuprofen-Diphenhydramine Cit Take 1 tablet by mouth at bedtime as needed (for pain or sleep).   OMEGA-3 FISH OIL PO Take 1,280 mg by mouth daily. 1280 mg per capsule   TURMERIC PO Take 1 tablet by mouth daily.   Vitamin D3 2000 units Tabs Take 2,000 Units by mouth daily.      Follow-up Information    Leighton Ruff, MD. Schedule an appointment as soon as possible for a visit in 2 week(s).   Specialty:  General Surgery Contact information: [address] [place] [address] [phone]           Signed: Pedro Earls 10/12/2016, 9:36 AM

## 2016-10-12 NOTE — Progress Notes (Signed)
Discharged from floor via w/c for transport home by car. Family & belongings with pt. No changes in assessment. Jackie Snow  

## 2017-03-11 DIAGNOSIS — Z23 Encounter for immunization: Secondary | ICD-10-CM | POA: Diagnosis not present

## 2017-03-11 DIAGNOSIS — M25562 Pain in left knee: Secondary | ICD-10-CM | POA: Diagnosis not present

## 2017-03-11 DIAGNOSIS — Z1389 Encounter for screening for other disorder: Secondary | ICD-10-CM | POA: Diagnosis not present

## 2017-03-11 DIAGNOSIS — M25561 Pain in right knee: Secondary | ICD-10-CM | POA: Diagnosis not present

## 2017-04-14 DIAGNOSIS — R69 Illness, unspecified: Secondary | ICD-10-CM | POA: Diagnosis not present

## 2017-06-03 HISTORY — PX: COLON RESECTION: SHX5231

## 2017-10-01 DIAGNOSIS — E039 Hypothyroidism, unspecified: Secondary | ICD-10-CM | POA: Diagnosis not present

## 2017-10-01 DIAGNOSIS — Z1389 Encounter for screening for other disorder: Secondary | ICD-10-CM | POA: Diagnosis not present

## 2017-10-22 DIAGNOSIS — R69 Illness, unspecified: Secondary | ICD-10-CM | POA: Diagnosis not present

## 2018-03-05 DIAGNOSIS — H1132 Conjunctival hemorrhage, left eye: Secondary | ICD-10-CM | POA: Diagnosis not present

## 2018-03-27 DIAGNOSIS — H2513 Age-related nuclear cataract, bilateral: Secondary | ICD-10-CM | POA: Diagnosis not present

## 2018-03-27 DIAGNOSIS — H5203 Hypermetropia, bilateral: Secondary | ICD-10-CM | POA: Diagnosis not present

## 2018-03-27 DIAGNOSIS — H01001 Unspecified blepharitis right upper eyelid: Secondary | ICD-10-CM | POA: Diagnosis not present

## 2018-03-27 DIAGNOSIS — H524 Presbyopia: Secondary | ICD-10-CM | POA: Diagnosis not present

## 2018-04-08 DIAGNOSIS — Z23 Encounter for immunization: Secondary | ICD-10-CM | POA: Diagnosis not present

## 2018-05-04 DIAGNOSIS — R69 Illness, unspecified: Secondary | ICD-10-CM | POA: Diagnosis not present

## 2018-05-21 DIAGNOSIS — M1712 Unilateral primary osteoarthritis, left knee: Secondary | ICD-10-CM | POA: Diagnosis not present

## 2018-05-21 DIAGNOSIS — G8929 Other chronic pain: Secondary | ICD-10-CM | POA: Diagnosis not present

## 2018-06-16 DIAGNOSIS — E039 Hypothyroidism, unspecified: Secondary | ICD-10-CM | POA: Diagnosis not present

## 2018-06-16 DIAGNOSIS — R7303 Prediabetes: Secondary | ICD-10-CM | POA: Diagnosis not present

## 2018-06-16 DIAGNOSIS — Z23 Encounter for immunization: Secondary | ICD-10-CM | POA: Diagnosis not present

## 2018-06-16 DIAGNOSIS — E785 Hyperlipidemia, unspecified: Secondary | ICD-10-CM | POA: Diagnosis not present

## 2018-06-16 DIAGNOSIS — Z1389 Encounter for screening for other disorder: Secondary | ICD-10-CM | POA: Diagnosis not present

## 2018-06-16 DIAGNOSIS — Z01818 Encounter for other preprocedural examination: Secondary | ICD-10-CM | POA: Diagnosis not present

## 2018-12-17 DIAGNOSIS — Z01812 Encounter for preprocedural laboratory examination: Secondary | ICD-10-CM | POA: Diagnosis not present

## 2019-01-06 DIAGNOSIS — L309 Dermatitis, unspecified: Secondary | ICD-10-CM | POA: Diagnosis not present

## 2019-01-20 DIAGNOSIS — M1712 Unilateral primary osteoarthritis, left knee: Secondary | ICD-10-CM | POA: Diagnosis not present

## 2019-01-25 DIAGNOSIS — M25662 Stiffness of left knee, not elsewhere classified: Secondary | ICD-10-CM | POA: Diagnosis not present

## 2019-01-25 DIAGNOSIS — M25462 Effusion, left knee: Secondary | ICD-10-CM | POA: Diagnosis not present

## 2019-01-25 DIAGNOSIS — M799 Soft tissue disorder, unspecified: Secondary | ICD-10-CM | POA: Diagnosis not present

## 2019-01-25 DIAGNOSIS — Z471 Aftercare following joint replacement surgery: Secondary | ICD-10-CM | POA: Diagnosis not present

## 2019-01-25 DIAGNOSIS — M62552 Muscle wasting and atrophy, not elsewhere classified, left thigh: Secondary | ICD-10-CM | POA: Diagnosis not present

## 2019-01-25 DIAGNOSIS — M25562 Pain in left knee: Secondary | ICD-10-CM | POA: Diagnosis not present

## 2019-01-25 DIAGNOSIS — R262 Difficulty in walking, not elsewhere classified: Secondary | ICD-10-CM | POA: Diagnosis not present

## 2019-01-26 DIAGNOSIS — Z471 Aftercare following joint replacement surgery: Secondary | ICD-10-CM | POA: Diagnosis not present

## 2019-01-26 DIAGNOSIS — M62552 Muscle wasting and atrophy, not elsewhere classified, left thigh: Secondary | ICD-10-CM | POA: Diagnosis not present

## 2019-01-26 DIAGNOSIS — M25462 Effusion, left knee: Secondary | ICD-10-CM | POA: Diagnosis not present

## 2019-01-26 DIAGNOSIS — R262 Difficulty in walking, not elsewhere classified: Secondary | ICD-10-CM | POA: Diagnosis not present

## 2019-01-26 DIAGNOSIS — M25562 Pain in left knee: Secondary | ICD-10-CM | POA: Diagnosis not present

## 2019-01-26 DIAGNOSIS — M799 Soft tissue disorder, unspecified: Secondary | ICD-10-CM | POA: Diagnosis not present

## 2019-01-26 DIAGNOSIS — M25662 Stiffness of left knee, not elsewhere classified: Secondary | ICD-10-CM | POA: Diagnosis not present

## 2019-01-28 DIAGNOSIS — Z471 Aftercare following joint replacement surgery: Secondary | ICD-10-CM | POA: Diagnosis not present

## 2019-01-28 DIAGNOSIS — Z96652 Presence of left artificial knee joint: Secondary | ICD-10-CM | POA: Diagnosis not present

## 2019-01-29 DIAGNOSIS — Z471 Aftercare following joint replacement surgery: Secondary | ICD-10-CM | POA: Diagnosis not present

## 2019-01-29 DIAGNOSIS — M799 Soft tissue disorder, unspecified: Secondary | ICD-10-CM | POA: Diagnosis not present

## 2019-01-29 DIAGNOSIS — M25662 Stiffness of left knee, not elsewhere classified: Secondary | ICD-10-CM | POA: Diagnosis not present

## 2019-01-29 DIAGNOSIS — M25562 Pain in left knee: Secondary | ICD-10-CM | POA: Diagnosis not present

## 2019-01-29 DIAGNOSIS — R262 Difficulty in walking, not elsewhere classified: Secondary | ICD-10-CM | POA: Diagnosis not present

## 2019-01-29 DIAGNOSIS — M25462 Effusion, left knee: Secondary | ICD-10-CM | POA: Diagnosis not present

## 2019-01-29 DIAGNOSIS — M62552 Muscle wasting and atrophy, not elsewhere classified, left thigh: Secondary | ICD-10-CM | POA: Diagnosis not present

## 2019-02-01 DIAGNOSIS — R262 Difficulty in walking, not elsewhere classified: Secondary | ICD-10-CM | POA: Diagnosis not present

## 2019-02-01 DIAGNOSIS — M62552 Muscle wasting and atrophy, not elsewhere classified, left thigh: Secondary | ICD-10-CM | POA: Diagnosis not present

## 2019-02-01 DIAGNOSIS — M25562 Pain in left knee: Secondary | ICD-10-CM | POA: Diagnosis not present

## 2019-02-01 DIAGNOSIS — M25662 Stiffness of left knee, not elsewhere classified: Secondary | ICD-10-CM | POA: Diagnosis not present

## 2019-02-01 DIAGNOSIS — M25462 Effusion, left knee: Secondary | ICD-10-CM | POA: Diagnosis not present

## 2019-02-01 DIAGNOSIS — Z471 Aftercare following joint replacement surgery: Secondary | ICD-10-CM | POA: Diagnosis not present

## 2019-02-01 DIAGNOSIS — M799 Soft tissue disorder, unspecified: Secondary | ICD-10-CM | POA: Diagnosis not present

## 2019-02-03 DIAGNOSIS — Z471 Aftercare following joint replacement surgery: Secondary | ICD-10-CM | POA: Diagnosis not present

## 2019-02-03 DIAGNOSIS — M25662 Stiffness of left knee, not elsewhere classified: Secondary | ICD-10-CM | POA: Diagnosis not present

## 2019-02-03 DIAGNOSIS — M25462 Effusion, left knee: Secondary | ICD-10-CM | POA: Diagnosis not present

## 2019-02-03 DIAGNOSIS — M799 Soft tissue disorder, unspecified: Secondary | ICD-10-CM | POA: Diagnosis not present

## 2019-02-03 DIAGNOSIS — M25562 Pain in left knee: Secondary | ICD-10-CM | POA: Diagnosis not present

## 2019-02-03 DIAGNOSIS — R262 Difficulty in walking, not elsewhere classified: Secondary | ICD-10-CM | POA: Diagnosis not present

## 2019-02-03 DIAGNOSIS — M62552 Muscle wasting and atrophy, not elsewhere classified, left thigh: Secondary | ICD-10-CM | POA: Diagnosis not present

## 2019-02-05 DIAGNOSIS — M799 Soft tissue disorder, unspecified: Secondary | ICD-10-CM | POA: Diagnosis not present

## 2019-02-05 DIAGNOSIS — M25462 Effusion, left knee: Secondary | ICD-10-CM | POA: Diagnosis not present

## 2019-02-05 DIAGNOSIS — M62552 Muscle wasting and atrophy, not elsewhere classified, left thigh: Secondary | ICD-10-CM | POA: Diagnosis not present

## 2019-02-05 DIAGNOSIS — R262 Difficulty in walking, not elsewhere classified: Secondary | ICD-10-CM | POA: Diagnosis not present

## 2019-02-05 DIAGNOSIS — M25662 Stiffness of left knee, not elsewhere classified: Secondary | ICD-10-CM | POA: Diagnosis not present

## 2019-02-05 DIAGNOSIS — Z471 Aftercare following joint replacement surgery: Secondary | ICD-10-CM | POA: Diagnosis not present

## 2019-02-05 DIAGNOSIS — M25562 Pain in left knee: Secondary | ICD-10-CM | POA: Diagnosis not present

## 2019-02-09 DIAGNOSIS — M62552 Muscle wasting and atrophy, not elsewhere classified, left thigh: Secondary | ICD-10-CM | POA: Diagnosis not present

## 2019-02-09 DIAGNOSIS — M25462 Effusion, left knee: Secondary | ICD-10-CM | POA: Diagnosis not present

## 2019-02-09 DIAGNOSIS — E785 Hyperlipidemia, unspecified: Secondary | ICD-10-CM | POA: Diagnosis not present

## 2019-02-09 DIAGNOSIS — Z1211 Encounter for screening for malignant neoplasm of colon: Secondary | ICD-10-CM | POA: Diagnosis not present

## 2019-02-09 DIAGNOSIS — E039 Hypothyroidism, unspecified: Secondary | ICD-10-CM | POA: Diagnosis not present

## 2019-02-09 DIAGNOSIS — M799 Soft tissue disorder, unspecified: Secondary | ICD-10-CM | POA: Diagnosis not present

## 2019-02-09 DIAGNOSIS — Z471 Aftercare following joint replacement surgery: Secondary | ICD-10-CM | POA: Diagnosis not present

## 2019-02-09 DIAGNOSIS — R262 Difficulty in walking, not elsewhere classified: Secondary | ICD-10-CM | POA: Diagnosis not present

## 2019-02-09 DIAGNOSIS — M25562 Pain in left knee: Secondary | ICD-10-CM | POA: Diagnosis not present

## 2019-02-09 DIAGNOSIS — Z1389 Encounter for screening for other disorder: Secondary | ICD-10-CM | POA: Diagnosis not present

## 2019-02-09 DIAGNOSIS — R7303 Prediabetes: Secondary | ICD-10-CM | POA: Diagnosis not present

## 2019-02-09 DIAGNOSIS — Z0000 Encounter for general adult medical examination without abnormal findings: Secondary | ICD-10-CM | POA: Diagnosis not present

## 2019-02-09 DIAGNOSIS — M25662 Stiffness of left knee, not elsewhere classified: Secondary | ICD-10-CM | POA: Diagnosis not present

## 2019-02-11 DIAGNOSIS — M799 Soft tissue disorder, unspecified: Secondary | ICD-10-CM | POA: Diagnosis not present

## 2019-02-11 DIAGNOSIS — R262 Difficulty in walking, not elsewhere classified: Secondary | ICD-10-CM | POA: Diagnosis not present

## 2019-02-11 DIAGNOSIS — Z471 Aftercare following joint replacement surgery: Secondary | ICD-10-CM | POA: Diagnosis not present

## 2019-02-11 DIAGNOSIS — M62552 Muscle wasting and atrophy, not elsewhere classified, left thigh: Secondary | ICD-10-CM | POA: Diagnosis not present

## 2019-02-11 DIAGNOSIS — M25562 Pain in left knee: Secondary | ICD-10-CM | POA: Diagnosis not present

## 2019-02-11 DIAGNOSIS — M25662 Stiffness of left knee, not elsewhere classified: Secondary | ICD-10-CM | POA: Diagnosis not present

## 2019-02-11 DIAGNOSIS — M25462 Effusion, left knee: Secondary | ICD-10-CM | POA: Diagnosis not present

## 2019-02-12 DIAGNOSIS — M25562 Pain in left knee: Secondary | ICD-10-CM | POA: Diagnosis not present

## 2019-02-12 DIAGNOSIS — M25462 Effusion, left knee: Secondary | ICD-10-CM | POA: Diagnosis not present

## 2019-02-12 DIAGNOSIS — M62552 Muscle wasting and atrophy, not elsewhere classified, left thigh: Secondary | ICD-10-CM | POA: Diagnosis not present

## 2019-02-12 DIAGNOSIS — M25662 Stiffness of left knee, not elsewhere classified: Secondary | ICD-10-CM | POA: Diagnosis not present

## 2019-02-12 DIAGNOSIS — R262 Difficulty in walking, not elsewhere classified: Secondary | ICD-10-CM | POA: Diagnosis not present

## 2019-02-12 DIAGNOSIS — Z471 Aftercare following joint replacement surgery: Secondary | ICD-10-CM | POA: Diagnosis not present

## 2019-02-12 DIAGNOSIS — M799 Soft tissue disorder, unspecified: Secondary | ICD-10-CM | POA: Diagnosis not present

## 2019-02-15 DIAGNOSIS — M799 Soft tissue disorder, unspecified: Secondary | ICD-10-CM | POA: Diagnosis not present

## 2019-02-15 DIAGNOSIS — M62552 Muscle wasting and atrophy, not elsewhere classified, left thigh: Secondary | ICD-10-CM | POA: Diagnosis not present

## 2019-02-15 DIAGNOSIS — R262 Difficulty in walking, not elsewhere classified: Secondary | ICD-10-CM | POA: Diagnosis not present

## 2019-02-15 DIAGNOSIS — M25562 Pain in left knee: Secondary | ICD-10-CM | POA: Diagnosis not present

## 2019-02-15 DIAGNOSIS — M25662 Stiffness of left knee, not elsewhere classified: Secondary | ICD-10-CM | POA: Diagnosis not present

## 2019-02-15 DIAGNOSIS — Z471 Aftercare following joint replacement surgery: Secondary | ICD-10-CM | POA: Diagnosis not present

## 2019-02-15 DIAGNOSIS — M25462 Effusion, left knee: Secondary | ICD-10-CM | POA: Diagnosis not present

## 2019-02-16 DIAGNOSIS — M25562 Pain in left knee: Secondary | ICD-10-CM | POA: Diagnosis not present

## 2019-02-16 DIAGNOSIS — M25662 Stiffness of left knee, not elsewhere classified: Secondary | ICD-10-CM | POA: Diagnosis not present

## 2019-02-16 DIAGNOSIS — M799 Soft tissue disorder, unspecified: Secondary | ICD-10-CM | POA: Diagnosis not present

## 2019-02-16 DIAGNOSIS — M62552 Muscle wasting and atrophy, not elsewhere classified, left thigh: Secondary | ICD-10-CM | POA: Diagnosis not present

## 2019-02-16 DIAGNOSIS — Z471 Aftercare following joint replacement surgery: Secondary | ICD-10-CM | POA: Diagnosis not present

## 2019-02-16 DIAGNOSIS — M25462 Effusion, left knee: Secondary | ICD-10-CM | POA: Diagnosis not present

## 2019-02-16 DIAGNOSIS — R262 Difficulty in walking, not elsewhere classified: Secondary | ICD-10-CM | POA: Diagnosis not present

## 2019-02-19 DIAGNOSIS — Z471 Aftercare following joint replacement surgery: Secondary | ICD-10-CM | POA: Diagnosis not present

## 2019-02-19 DIAGNOSIS — R262 Difficulty in walking, not elsewhere classified: Secondary | ICD-10-CM | POA: Diagnosis not present

## 2019-02-19 DIAGNOSIS — M62552 Muscle wasting and atrophy, not elsewhere classified, left thigh: Secondary | ICD-10-CM | POA: Diagnosis not present

## 2019-02-19 DIAGNOSIS — M25662 Stiffness of left knee, not elsewhere classified: Secondary | ICD-10-CM | POA: Diagnosis not present

## 2019-02-19 DIAGNOSIS — M25462 Effusion, left knee: Secondary | ICD-10-CM | POA: Diagnosis not present

## 2019-02-19 DIAGNOSIS — M25562 Pain in left knee: Secondary | ICD-10-CM | POA: Diagnosis not present

## 2019-02-19 DIAGNOSIS — M799 Soft tissue disorder, unspecified: Secondary | ICD-10-CM | POA: Diagnosis not present

## 2019-02-23 DIAGNOSIS — Z471 Aftercare following joint replacement surgery: Secondary | ICD-10-CM | POA: Diagnosis not present

## 2019-02-23 DIAGNOSIS — R262 Difficulty in walking, not elsewhere classified: Secondary | ICD-10-CM | POA: Diagnosis not present

## 2019-02-23 DIAGNOSIS — M25562 Pain in left knee: Secondary | ICD-10-CM | POA: Diagnosis not present

## 2019-02-23 DIAGNOSIS — M62552 Muscle wasting and atrophy, not elsewhere classified, left thigh: Secondary | ICD-10-CM | POA: Diagnosis not present

## 2019-02-23 DIAGNOSIS — M799 Soft tissue disorder, unspecified: Secondary | ICD-10-CM | POA: Diagnosis not present

## 2019-02-23 DIAGNOSIS — M25462 Effusion, left knee: Secondary | ICD-10-CM | POA: Diagnosis not present

## 2019-02-23 DIAGNOSIS — M25662 Stiffness of left knee, not elsewhere classified: Secondary | ICD-10-CM | POA: Diagnosis not present

## 2019-02-25 DIAGNOSIS — M25562 Pain in left knee: Secondary | ICD-10-CM | POA: Diagnosis not present

## 2019-02-25 DIAGNOSIS — M799 Soft tissue disorder, unspecified: Secondary | ICD-10-CM | POA: Diagnosis not present

## 2019-02-25 DIAGNOSIS — R262 Difficulty in walking, not elsewhere classified: Secondary | ICD-10-CM | POA: Diagnosis not present

## 2019-02-25 DIAGNOSIS — Z471 Aftercare following joint replacement surgery: Secondary | ICD-10-CM | POA: Diagnosis not present

## 2019-02-25 DIAGNOSIS — M25462 Effusion, left knee: Secondary | ICD-10-CM | POA: Diagnosis not present

## 2019-02-25 DIAGNOSIS — M25662 Stiffness of left knee, not elsewhere classified: Secondary | ICD-10-CM | POA: Diagnosis not present

## 2019-02-25 DIAGNOSIS — M62552 Muscle wasting and atrophy, not elsewhere classified, left thigh: Secondary | ICD-10-CM | POA: Diagnosis not present

## 2019-03-04 DIAGNOSIS — M25662 Stiffness of left knee, not elsewhere classified: Secondary | ICD-10-CM | POA: Diagnosis not present

## 2019-03-04 DIAGNOSIS — M799 Soft tissue disorder, unspecified: Secondary | ICD-10-CM | POA: Diagnosis not present

## 2019-03-04 DIAGNOSIS — M25562 Pain in left knee: Secondary | ICD-10-CM | POA: Diagnosis not present

## 2019-03-04 DIAGNOSIS — Z471 Aftercare following joint replacement surgery: Secondary | ICD-10-CM | POA: Diagnosis not present

## 2019-03-04 DIAGNOSIS — R262 Difficulty in walking, not elsewhere classified: Secondary | ICD-10-CM | POA: Diagnosis not present

## 2019-03-04 DIAGNOSIS — M25462 Effusion, left knee: Secondary | ICD-10-CM | POA: Diagnosis not present

## 2019-03-04 DIAGNOSIS — M62552 Muscle wasting and atrophy, not elsewhere classified, left thigh: Secondary | ICD-10-CM | POA: Diagnosis not present

## 2019-03-08 DIAGNOSIS — Z1211 Encounter for screening for malignant neoplasm of colon: Secondary | ICD-10-CM | POA: Diagnosis not present

## 2019-03-08 DIAGNOSIS — R262 Difficulty in walking, not elsewhere classified: Secondary | ICD-10-CM | POA: Diagnosis not present

## 2019-03-08 DIAGNOSIS — M25662 Stiffness of left knee, not elsewhere classified: Secondary | ICD-10-CM | POA: Diagnosis not present

## 2019-03-08 DIAGNOSIS — M799 Soft tissue disorder, unspecified: Secondary | ICD-10-CM | POA: Diagnosis not present

## 2019-03-08 DIAGNOSIS — Z471 Aftercare following joint replacement surgery: Secondary | ICD-10-CM | POA: Diagnosis not present

## 2019-03-08 DIAGNOSIS — M62552 Muscle wasting and atrophy, not elsewhere classified, left thigh: Secondary | ICD-10-CM | POA: Diagnosis not present

## 2019-03-08 DIAGNOSIS — N390 Urinary tract infection, site not specified: Secondary | ICD-10-CM | POA: Diagnosis not present

## 2019-03-08 DIAGNOSIS — M25462 Effusion, left knee: Secondary | ICD-10-CM | POA: Diagnosis not present

## 2019-03-08 DIAGNOSIS — M25562 Pain in left knee: Secondary | ICD-10-CM | POA: Diagnosis not present

## 2019-03-11 DIAGNOSIS — R69 Illness, unspecified: Secondary | ICD-10-CM | POA: Diagnosis not present

## 2019-03-12 DIAGNOSIS — Z471 Aftercare following joint replacement surgery: Secondary | ICD-10-CM | POA: Diagnosis not present

## 2019-03-12 DIAGNOSIS — M25662 Stiffness of left knee, not elsewhere classified: Secondary | ICD-10-CM | POA: Diagnosis not present

## 2019-03-12 DIAGNOSIS — M25562 Pain in left knee: Secondary | ICD-10-CM | POA: Diagnosis not present

## 2019-03-12 DIAGNOSIS — M62552 Muscle wasting and atrophy, not elsewhere classified, left thigh: Secondary | ICD-10-CM | POA: Diagnosis not present

## 2019-03-12 DIAGNOSIS — R262 Difficulty in walking, not elsewhere classified: Secondary | ICD-10-CM | POA: Diagnosis not present

## 2019-03-12 DIAGNOSIS — M799 Soft tissue disorder, unspecified: Secondary | ICD-10-CM | POA: Diagnosis not present

## 2019-03-12 DIAGNOSIS — M25462 Effusion, left knee: Secondary | ICD-10-CM | POA: Diagnosis not present

## 2019-03-18 DIAGNOSIS — M25662 Stiffness of left knee, not elsewhere classified: Secondary | ICD-10-CM | POA: Diagnosis not present

## 2019-03-18 DIAGNOSIS — Z471 Aftercare following joint replacement surgery: Secondary | ICD-10-CM | POA: Diagnosis not present

## 2019-03-18 DIAGNOSIS — M799 Soft tissue disorder, unspecified: Secondary | ICD-10-CM | POA: Diagnosis not present

## 2019-03-18 DIAGNOSIS — R262 Difficulty in walking, not elsewhere classified: Secondary | ICD-10-CM | POA: Diagnosis not present

## 2019-03-18 DIAGNOSIS — M25562 Pain in left knee: Secondary | ICD-10-CM | POA: Diagnosis not present

## 2019-03-18 DIAGNOSIS — M62552 Muscle wasting and atrophy, not elsewhere classified, left thigh: Secondary | ICD-10-CM | POA: Diagnosis not present

## 2019-03-18 DIAGNOSIS — M25462 Effusion, left knee: Secondary | ICD-10-CM | POA: Diagnosis not present

## 2019-03-22 DIAGNOSIS — E785 Hyperlipidemia, unspecified: Secondary | ICD-10-CM | POA: Diagnosis not present

## 2019-03-22 DIAGNOSIS — Z1211 Encounter for screening for malignant neoplasm of colon: Secondary | ICD-10-CM | POA: Diagnosis not present

## 2019-03-22 DIAGNOSIS — E039 Hypothyroidism, unspecified: Secondary | ICD-10-CM | POA: Diagnosis not present

## 2019-03-22 DIAGNOSIS — Z23 Encounter for immunization: Secondary | ICD-10-CM | POA: Diagnosis not present

## 2019-05-03 ENCOUNTER — Other Ambulatory Visit: Payer: Self-pay | Admitting: Ophthalmology

## 2019-05-03 DIAGNOSIS — Z1231 Encounter for screening mammogram for malignant neoplasm of breast: Secondary | ICD-10-CM

## 2019-05-13 DIAGNOSIS — Z96652 Presence of left artificial knee joint: Secondary | ICD-10-CM | POA: Diagnosis not present

## 2019-06-04 HISTORY — PX: REPLACEMENT TOTAL KNEE: SUR1224

## 2019-06-24 ENCOUNTER — Other Ambulatory Visit: Payer: Self-pay

## 2019-06-24 ENCOUNTER — Ambulatory Visit
Admission: RE | Admit: 2019-06-24 | Discharge: 2019-06-24 | Disposition: A | Discharge status: Home / Self Care | Payer: Medicare HMO | Source: Ambulatory Visit | Attending: Ophthalmology | Admitting: Ophthalmology

## 2019-06-24 DIAGNOSIS — Z1231 Encounter for screening mammogram for malignant neoplasm of breast: Secondary | ICD-10-CM | POA: Diagnosis not present

## 2019-06-28 ENCOUNTER — Other Ambulatory Visit: Payer: Self-pay | Admitting: Ophthalmology

## 2019-06-28 DIAGNOSIS — R928 Other abnormal and inconclusive findings on diagnostic imaging of breast: Secondary | ICD-10-CM

## 2019-07-01 ENCOUNTER — Other Ambulatory Visit: Payer: Self-pay | Admitting: Family Medicine

## 2019-07-01 DIAGNOSIS — R928 Other abnormal and inconclusive findings on diagnostic imaging of breast: Secondary | ICD-10-CM

## 2019-07-07 ENCOUNTER — Inpatient Hospital Stay: Admission: RE | Admit: 2019-07-07 | Payer: Medicare HMO | Source: Ambulatory Visit

## 2019-07-09 ENCOUNTER — Ambulatory Visit
Admission: RE | Admit: 2019-07-09 | Discharge: 2019-07-09 | Disposition: A | Discharge status: Home / Self Care | Payer: Medicare HMO | Source: Ambulatory Visit | Attending: Family Medicine | Admitting: Family Medicine

## 2019-07-09 ENCOUNTER — Other Ambulatory Visit: Payer: Self-pay | Admitting: Family Medicine

## 2019-07-09 ENCOUNTER — Other Ambulatory Visit: Payer: Self-pay

## 2019-07-09 DIAGNOSIS — N6313 Unspecified lump in the right breast, lower outer quadrant: Secondary | ICD-10-CM | POA: Diagnosis not present

## 2019-07-09 DIAGNOSIS — R928 Other abnormal and inconclusive findings on diagnostic imaging of breast: Secondary | ICD-10-CM

## 2019-07-09 DIAGNOSIS — N6314 Unspecified lump in the right breast, lower inner quadrant: Secondary | ICD-10-CM | POA: Diagnosis not present

## 2019-07-19 ENCOUNTER — Other Ambulatory Visit: Payer: Self-pay

## 2019-07-19 ENCOUNTER — Ambulatory Visit
Admission: RE | Admit: 2019-07-19 | Discharge: 2019-07-19 | Disposition: A | Discharge status: Home / Self Care | Payer: Medicare HMO | Source: Ambulatory Visit | Attending: Family Medicine | Admitting: Family Medicine

## 2019-07-19 DIAGNOSIS — N6314 Unspecified lump in the right breast, lower inner quadrant: Secondary | ICD-10-CM | POA: Diagnosis not present

## 2019-07-19 DIAGNOSIS — D242 Benign neoplasm of left breast: Secondary | ICD-10-CM | POA: Diagnosis not present

## 2019-07-19 DIAGNOSIS — R928 Other abnormal and inconclusive findings on diagnostic imaging of breast: Secondary | ICD-10-CM

## 2019-07-19 DIAGNOSIS — N6313 Unspecified lump in the right breast, lower outer quadrant: Secondary | ICD-10-CM | POA: Diagnosis not present

## 2019-07-20 HISTORY — PX: BREAST BIOPSY: SHX20

## 2019-07-23 ENCOUNTER — Other Ambulatory Visit: Payer: Self-pay | Admitting: Obstetrics

## 2019-07-23 DIAGNOSIS — E2839 Other primary ovarian failure: Secondary | ICD-10-CM

## 2019-07-23 DIAGNOSIS — Z01419 Encounter for gynecological examination (general) (routine) without abnormal findings: Secondary | ICD-10-CM | POA: Diagnosis not present

## 2019-08-01 ENCOUNTER — Ambulatory Visit: Payer: Medicare HMO | Attending: Internal Medicine

## 2019-08-01 DIAGNOSIS — Z23 Encounter for immunization: Secondary | ICD-10-CM

## 2019-08-01 NOTE — Progress Notes (Signed)
   Covid-19 Vaccination Clinic  Name:  [name]    MRN: [id number] DOB: 10/03/1945  08/01/2019  [name] was observed post Covid-19 immunization for 15 minutes without incidence. She was provided with Vaccine Information Sheet and instruction to access the V-Safe system.   [name] was instructed to call 911 with any severe reactions post vaccine: Marland Kitchen Difficulty breathing  . Swelling of your face and throat  . A fast heartbeat  . A bad rash all over your body  . Dizziness and weakness    Immunizations Administered    Name Date Dose VIS Date Route   Pfizer COVID-19 Vaccine 08/01/2019  1:29 PM 0.3 mL 05/14/2019 Intramuscular   Manufacturer: [place]   Lot: [id number]   [place]: [id number]

## 2019-08-02 DIAGNOSIS — D241 Benign neoplasm of right breast: Secondary | ICD-10-CM | POA: Diagnosis not present

## 2019-08-31 ENCOUNTER — Ambulatory Visit: Payer: Medicare HMO | Attending: Internal Medicine

## 2019-08-31 DIAGNOSIS — Z23 Encounter for immunization: Secondary | ICD-10-CM

## 2019-08-31 NOTE — Progress Notes (Signed)
   Covid-19 Vaccination Clinic  Name:  Jackie Snow    MRN: [id number] DOB: 06/29/1945  08/31/2019  [name] was observed post Covid-19 immunization for 15 minutes without incident. She was provided with Vaccine Information Sheet and instruction to access the V-Safe system.   [name] was instructed to call 911 with any severe reactions post vaccine: Marland Kitchen Difficulty breathing  . Swelling of face and throat  . A fast heartbeat  . A bad rash all over body  . Dizziness and weakness   Immunizations Administered    Name Date Dose VIS Date Route   Pfizer COVID-19 Vaccine 08/31/2019  1:55 PM 0.3 mL 05/14/2019 Intramuscular   Manufacturer: [place]   Lot: [id number]   [place]: [id number]

## 2020-01-03 DIAGNOSIS — E669 Obesity, unspecified: Secondary | ICD-10-CM | POA: Diagnosis not present

## 2020-01-03 DIAGNOSIS — Z833 Family history of diabetes mellitus: Secondary | ICD-10-CM | POA: Diagnosis not present

## 2020-01-03 DIAGNOSIS — E039 Hypothyroidism, unspecified: Secondary | ICD-10-CM | POA: Diagnosis not present

## 2020-01-03 DIAGNOSIS — Z8249 Family history of ischemic heart disease and other diseases of the circulatory system: Secondary | ICD-10-CM | POA: Diagnosis not present

## 2020-01-03 DIAGNOSIS — R030 Elevated blood-pressure reading, without diagnosis of hypertension: Secondary | ICD-10-CM | POA: Diagnosis not present

## 2020-01-03 DIAGNOSIS — Z803 Family history of malignant neoplasm of breast: Secondary | ICD-10-CM | POA: Diagnosis not present

## 2020-01-03 DIAGNOSIS — Z87891 Personal history of nicotine dependence: Secondary | ICD-10-CM | POA: Diagnosis not present

## 2020-01-03 DIAGNOSIS — M1990 Unspecified osteoarthritis, unspecified site: Secondary | ICD-10-CM | POA: Diagnosis not present

## 2020-01-03 DIAGNOSIS — Z6834 Body mass index (BMI) 34.0-34.9, adult: Secondary | ICD-10-CM | POA: Diagnosis not present

## 2020-01-13 DIAGNOSIS — Z96652 Presence of left artificial knee joint: Secondary | ICD-10-CM | POA: Diagnosis not present

## 2020-01-14 ENCOUNTER — Encounter: Payer: Self-pay | Admitting: Genetic Counselor

## 2020-01-18 DIAGNOSIS — Z1389 Encounter for screening for other disorder: Secondary | ICD-10-CM | POA: Diagnosis not present

## 2020-01-18 DIAGNOSIS — J029 Acute pharyngitis, unspecified: Secondary | ICD-10-CM | POA: Diagnosis not present

## 2020-01-20 DIAGNOSIS — Z20822 Contact with and (suspected) exposure to covid-19: Secondary | ICD-10-CM | POA: Diagnosis not present

## 2020-01-27 ENCOUNTER — Other Ambulatory Visit: Payer: Self-pay | Admitting: General Surgery

## 2020-01-27 DIAGNOSIS — D241 Benign neoplasm of right breast: Secondary | ICD-10-CM

## 2020-02-01 DIAGNOSIS — Z20822 Contact with and (suspected) exposure to covid-19: Secondary | ICD-10-CM | POA: Diagnosis not present

## 2020-02-24 ENCOUNTER — Ambulatory Visit
Admission: RE | Admit: 2020-02-24 | Discharge: 2020-02-24 | Disposition: A | Discharge status: Home / Self Care | Payer: Medicare HMO | Source: Ambulatory Visit | Attending: General Surgery | Admitting: General Surgery

## 2020-02-24 ENCOUNTER — Other Ambulatory Visit: Payer: Self-pay

## 2020-02-24 ENCOUNTER — Other Ambulatory Visit: Payer: Self-pay | Admitting: General Surgery

## 2020-02-24 ENCOUNTER — Ambulatory Visit: Payer: Medicare HMO

## 2020-02-24 DIAGNOSIS — D241 Benign neoplasm of right breast: Secondary | ICD-10-CM

## 2020-02-24 DIAGNOSIS — R928 Other abnormal and inconclusive findings on diagnostic imaging of breast: Secondary | ICD-10-CM | POA: Diagnosis not present

## 2020-03-21 DIAGNOSIS — E039 Hypothyroidism, unspecified: Secondary | ICD-10-CM | POA: Diagnosis not present

## 2020-03-21 DIAGNOSIS — R7309 Other abnormal glucose: Secondary | ICD-10-CM | POA: Diagnosis not present

## 2020-03-21 DIAGNOSIS — I700 Atherosclerosis of aorta: Secondary | ICD-10-CM | POA: Diagnosis not present

## 2020-03-21 DIAGNOSIS — Z0000 Encounter for general adult medical examination without abnormal findings: Secondary | ICD-10-CM | POA: Diagnosis not present

## 2020-03-21 DIAGNOSIS — Z1389 Encounter for screening for other disorder: Secondary | ICD-10-CM | POA: Diagnosis not present

## 2020-03-21 DIAGNOSIS — E785 Hyperlipidemia, unspecified: Secondary | ICD-10-CM | POA: Diagnosis not present

## 2020-03-21 DIAGNOSIS — Z23 Encounter for immunization: Secondary | ICD-10-CM | POA: Diagnosis not present

## 2020-03-27 DIAGNOSIS — D241 Benign neoplasm of right breast: Secondary | ICD-10-CM | POA: Diagnosis not present

## 2020-03-30 DIAGNOSIS — E039 Hypothyroidism, unspecified: Secondary | ICD-10-CM | POA: Diagnosis not present

## 2020-03-30 DIAGNOSIS — E785 Hyperlipidemia, unspecified: Secondary | ICD-10-CM | POA: Diagnosis not present

## 2020-03-30 DIAGNOSIS — R7309 Other abnormal glucose: Secondary | ICD-10-CM | POA: Diagnosis not present

## 2020-04-17 DIAGNOSIS — Z23 Encounter for immunization: Secondary | ICD-10-CM | POA: Diagnosis not present

## 2020-08-24 ENCOUNTER — Ambulatory Visit
Admission: RE | Admit: 2020-08-24 | Discharge: 2020-08-24 | Disposition: A | Discharge status: Home / Self Care | Payer: Medicare HMO | Source: Ambulatory Visit | Attending: General Surgery | Admitting: General Surgery

## 2020-08-24 ENCOUNTER — Other Ambulatory Visit: Payer: Self-pay

## 2020-08-24 DIAGNOSIS — R928 Other abnormal and inconclusive findings on diagnostic imaging of breast: Secondary | ICD-10-CM | POA: Diagnosis not present

## 2020-08-24 DIAGNOSIS — D241 Benign neoplasm of right breast: Secondary | ICD-10-CM

## 2020-08-29 DIAGNOSIS — J029 Acute pharyngitis, unspecified: Secondary | ICD-10-CM | POA: Diagnosis not present

## 2020-08-29 DIAGNOSIS — E785 Hyperlipidemia, unspecified: Secondary | ICD-10-CM | POA: Diagnosis not present

## 2020-08-29 DIAGNOSIS — J3489 Other specified disorders of nose and nasal sinuses: Secondary | ICD-10-CM | POA: Diagnosis not present

## 2020-09-04 DIAGNOSIS — Z7722 Contact with and (suspected) exposure to environmental tobacco smoke (acute) (chronic): Secondary | ICD-10-CM | POA: Diagnosis not present

## 2020-09-04 DIAGNOSIS — Z6831 Body mass index (BMI) 31.0-31.9, adult: Secondary | ICD-10-CM | POA: Diagnosis not present

## 2020-09-04 DIAGNOSIS — Z87891 Personal history of nicotine dependence: Secondary | ICD-10-CM | POA: Diagnosis not present

## 2020-09-04 DIAGNOSIS — Z823 Family history of stroke: Secondary | ICD-10-CM | POA: Diagnosis not present

## 2020-09-04 DIAGNOSIS — E669 Obesity, unspecified: Secondary | ICD-10-CM | POA: Diagnosis not present

## 2020-09-04 DIAGNOSIS — E785 Hyperlipidemia, unspecified: Secondary | ICD-10-CM | POA: Diagnosis not present

## 2020-09-04 DIAGNOSIS — E039 Hypothyroidism, unspecified: Secondary | ICD-10-CM | POA: Diagnosis not present

## 2020-09-04 DIAGNOSIS — Z8249 Family history of ischemic heart disease and other diseases of the circulatory system: Secondary | ICD-10-CM | POA: Diagnosis not present

## 2020-09-04 DIAGNOSIS — Z85828 Personal history of other malignant neoplasm of skin: Secondary | ICD-10-CM | POA: Diagnosis not present

## 2020-09-12 DIAGNOSIS — J019 Acute sinusitis, unspecified: Secondary | ICD-10-CM | POA: Diagnosis not present

## 2020-09-15 DIAGNOSIS — K115 Sialolithiasis: Secondary | ICD-10-CM | POA: Diagnosis not present

## 2020-10-05 DIAGNOSIS — D241 Benign neoplasm of right breast: Secondary | ICD-10-CM | POA: Diagnosis not present

## 2021-02-02 DIAGNOSIS — J019 Acute sinusitis, unspecified: Secondary | ICD-10-CM | POA: Diagnosis not present

## 2021-02-28 DIAGNOSIS — U071 COVID-19: Secondary | ICD-10-CM | POA: Diagnosis not present

## 2021-03-26 DIAGNOSIS — I700 Atherosclerosis of aorta: Secondary | ICD-10-CM | POA: Diagnosis not present

## 2021-03-26 DIAGNOSIS — Z0000 Encounter for general adult medical examination without abnormal findings: Secondary | ICD-10-CM | POA: Diagnosis not present

## 2021-03-26 DIAGNOSIS — R7303 Prediabetes: Secondary | ICD-10-CM | POA: Diagnosis not present

## 2021-03-26 DIAGNOSIS — E039 Hypothyroidism, unspecified: Secondary | ICD-10-CM | POA: Diagnosis not present

## 2021-03-26 DIAGNOSIS — Z1389 Encounter for screening for other disorder: Secondary | ICD-10-CM | POA: Diagnosis not present

## 2021-03-26 DIAGNOSIS — Z23 Encounter for immunization: Secondary | ICD-10-CM | POA: Diagnosis not present

## 2021-03-26 DIAGNOSIS — E785 Hyperlipidemia, unspecified: Secondary | ICD-10-CM | POA: Diagnosis not present

## 2021-06-11 DIAGNOSIS — Z86010 Personal history of colonic polyps: Secondary | ICD-10-CM | POA: Diagnosis not present

## 2021-06-11 DIAGNOSIS — Z980 Intestinal bypass and anastomosis status: Secondary | ICD-10-CM | POA: Diagnosis not present

## 2021-06-11 DIAGNOSIS — Z09 Encounter for follow-up examination after completed treatment for conditions other than malignant neoplasm: Secondary | ICD-10-CM | POA: Diagnosis not present

## 2021-07-17 DIAGNOSIS — C4442 Squamous cell carcinoma of skin of scalp and neck: Secondary | ICD-10-CM | POA: Diagnosis not present

## 2021-07-17 DIAGNOSIS — L218 Other seborrheic dermatitis: Secondary | ICD-10-CM | POA: Diagnosis not present

## 2021-07-17 DIAGNOSIS — D485 Neoplasm of uncertain behavior of skin: Secondary | ICD-10-CM | POA: Diagnosis not present

## 2021-07-31 ENCOUNTER — Other Ambulatory Visit: Payer: Self-pay | Admitting: General Surgery

## 2021-07-31 DIAGNOSIS — Z9889 Other specified postprocedural states: Secondary | ICD-10-CM

## 2021-08-21 DIAGNOSIS — D0439 Carcinoma in situ of skin of other parts of face: Secondary | ICD-10-CM | POA: Diagnosis not present

## 2021-08-21 HISTORY — PX: SKIN CANCER EXCISION: SHX779

## 2021-09-28 ENCOUNTER — Other Ambulatory Visit: Payer: Self-pay | Admitting: General Surgery

## 2021-09-28 DIAGNOSIS — D241 Benign neoplasm of right breast: Secondary | ICD-10-CM

## 2021-09-28 DIAGNOSIS — Z9889 Other specified postprocedural states: Secondary | ICD-10-CM

## 2021-10-02 ENCOUNTER — Ambulatory Visit
Admission: RE | Admit: 2021-10-02 | Discharge: 2021-10-02 | Disposition: A | Discharge status: Home / Self Care | Payer: Medicare HMO | Source: Ambulatory Visit | Attending: General Surgery | Admitting: General Surgery

## 2021-10-02 DIAGNOSIS — Z9889 Other specified postprocedural states: Secondary | ICD-10-CM

## 2021-10-02 DIAGNOSIS — R928 Other abnormal and inconclusive findings on diagnostic imaging of breast: Secondary | ICD-10-CM | POA: Diagnosis not present

## 2021-10-02 DIAGNOSIS — D241 Benign neoplasm of right breast: Secondary | ICD-10-CM

## 2021-10-02 DIAGNOSIS — H5203 Hypermetropia, bilateral: Secondary | ICD-10-CM | POA: Diagnosis not present

## 2021-10-02 DIAGNOSIS — H532 Diplopia: Secondary | ICD-10-CM | POA: Diagnosis not present

## 2021-10-10 DIAGNOSIS — L905 Scar conditions and fibrosis of skin: Secondary | ICD-10-CM | POA: Diagnosis not present

## 2021-10-16 ENCOUNTER — Encounter: Payer: Self-pay | Admitting: *Deleted

## 2021-10-16 DIAGNOSIS — H532 Diplopia: Secondary | ICD-10-CM | POA: Diagnosis not present

## 2021-10-18 ENCOUNTER — Ambulatory Visit: Payer: Medicare HMO | Admitting: Diagnostic Neuroimaging

## 2021-10-18 ENCOUNTER — Encounter: Payer: Self-pay | Admitting: Diagnostic Neuroimaging

## 2021-10-18 VITALS — BP 128/68 | HR 65 | Ht 64.5 in | Wt 191.0 lb

## 2021-10-18 DIAGNOSIS — H532 Diplopia: Secondary | ICD-10-CM

## 2021-10-18 DIAGNOSIS — R7309 Other abnormal glucose: Secondary | ICD-10-CM | POA: Diagnosis not present

## 2021-10-18 DIAGNOSIS — R4781 Slurred speech: Secondary | ICD-10-CM | POA: Diagnosis not present

## 2021-10-18 MED ORDER — ALPRAZOLAM 0.5 MG PO TABS: ORAL_TABLET | ORAL | 0 refills | Status: AC

## 2021-10-18 NOTE — Progress Notes (Signed)
[facility]  PATIENT: Jackie Snow DOB: [birth year]  REFERRING CLINICIAN: Melissa Noon, OD HISTORY FROM: patient REASON FOR VISIT: new consult    HISTORICAL  CHIEF COMPLAINT:  Chief Complaint  Patient presents with   Vertical diplopia    Rm 7 New Pt  dgtrMagda Paganini, grand dgtr- Sophia    HISTORY OF PRESENT ILLNESS:   76 year old female here for evaluation of vision problems.  September 27, 2021 patient had onset of blurred vision and double vision.  Symptoms would fluctuate in the more noted when she was fatigued.  She also had an episode of speech difficulty for several minutes.  Patient went to ophthalmologist for evaluation.  She was recommended to try prism lenses which seem to help.  Symptoms have gradually improved.  Denies any other swallowing or chewing problems.  No muscle weakness in arms or legs.    REVIEW OF SYSTEMS: Full 14 system review of systems performed and negative with exception of: as per HPI.  ALLERGIES: No Known Allergies  HOME MEDICATIONS: Outpatient Medications Prior to Visit  Medication Sig Dispense Refill   atorvastatin (LIPITOR) 10 MG tablet 1 TABLET ORALLY ONCE A WEEK 30 DAY(S)     Cholecalciferol (VITAMIN D3) 2000 units TABS Take 2,000 Units by mouth daily.     levothyroxine (SYNTHROID, LEVOTHROID) 112 MCG tablet Take 112 mcg by mouth See admin instructions. Takes 112 mcg  5 days week day and 125 mcg on Mondays and Thursdays     levothyroxine (SYNTHROID, LEVOTHROID) 125 MCG tablet Take 125 mcg by mouth 2 (two) times a week. Takes 125 mcg on Monday and Thursdays  And 112 mcg all other days of the week     Omega-3 Fatty Acids (OMEGA-3 FISH OIL PO) Take 1,280 mg by mouth daily. 1280 mg per capsule     HYDROcodone-acetaminophen (NORCO/VICODIN) 5-325 MG tablet Take 1-2 tablets by mouth every 6 (six) hours as needed for moderate pain. 30 tablet 0   Ibuprofen-Diphenhydramine Cit (MOTRIN PM) 200-38 MG TABS Take 1 tablet by mouth at bedtime  as needed (for pain or sleep).     TURMERIC PO Take 1 tablet by mouth daily.     No facility-administered medications prior to visit.    PAST MEDICAL HISTORY: Past Medical History:  Diagnosis Date   Anemia    Arthritis    bilateral knees   Cancer ([place])    melanoma on nose , excised 4 years ago    Colovesical fistula    Diplopia    GERD (gastroesophageal reflux disease)    Hypothyroidism    Skin cancer     PAST SURGICAL HISTORY: Past Surgical History:  Procedure Laterality Date   APPENDECTOMY     childhood    BREAST BIOPSY Right 07/20/2019   intraductal papilloma   CHOLECYSTECTOMY     COLON RESECTION  2019   diverticulitis   CYSTOSCOPY WITH INJECTION Left 10/09/2016   Procedure: CYSTOSCOPY WITH FIREFLY INJECTION;  Surgeon: Ardis Hughs, MD;  Location: [facility];  Service: Urology;  Laterality: Left;   REPLACEMENT TOTAL KNEE Left 2021   SKIN CANCER EXCISION  08/21/2021   squamous cell   TONSILLECTOMY     childhood     FAMILY HISTORY: Family History  Problem Relation Age of Onset   Heart attack Father     SOCIAL HISTORY: Social History   Socioeconomic History   Marital status: Widowed    Spouse name: Not on file   Number of children: 2  Years of education: Not on file   Highest education level: Bachelor's degree (e.g., BA, AB, BS)  Occupational History    Comment: [employer]  Tobacco Use   Smoking status: Former    Years: 20.00    Types: Cigarettes   Smokeless tobacco: Never   Tobacco comments:    quit greater than 35 years ago  Vaping Use   Vaping Use: Never used  Substance and Sexual Activity   Alcohol use: No   Drug use: No   Sexual activity: Not on file  Other Topics Concern   Not on file  Social History Narrative   Lives with grand dgtr, 1 dgtr deceased of breast cancer   Caffeine- 2 c tea daily   Social Determinants of Health   Financial Resource Strain: Not on file  Food Insecurity: Not on file  Transportation  Needs: Not on file  Physical Activity: Not on file  Stress: Not on file  Social Connections: Not on file  Intimate Partner Violence: Not on file     PHYSICAL EXAM  GENERAL EXAM/CONSTITUTIONAL: Vitals:  Vitals:   10/18/21 1402  BP: 128/68  Pulse: 65  Weight: 191 lb (86.6 kg)  Height: 5' 4.5" (1.638 m)   Body mass index is 32.28 kg/m. Wt Readings from Last 3 Encounters:  10/18/21 191 lb (86.6 kg)  10/09/16 194 lb (88 kg)  10/02/16 194 lb 9.6 oz (88.3 kg)   Patient is in no distress; well developed, nourished and groomed; neck is supple  CARDIOVASCULAR: Examination of carotid arteries is normal; no carotid bruits Regular rate and rhythm, no murmurs Examination of peripheral vascular system by observation and palpation is normal  EYES: Ophthalmoscopic exam of optic discs and posterior segments is normal; no papilledema or hemorrhages No results found.  MUSCULOSKELETAL: Gait, strength, tone, movements noted in Neurologic exam below  NEUROLOGIC: MENTAL STATUS:      View : No data to display.         awake, alert, oriented to person, place and time recent and remote memory intact normal attention and concentration language fluent, comprehension intact, naming intact fund of knowledge appropriate  CRANIAL NERVE:  2nd - no papilledema on fundoscopic exam 2nd, 3rd, 4th, 6th - pupils equal and reactive to light, visual fields full to confrontation, extraocular muscles intact, no nystagmus 5th - facial sensation symmetric 7th - facial strength symmetric 8th - hearing intact 9th - palate elevates symmetrically, uvula midline 11th - shoulder shrug symmetric 12th - tongue protrusion midline  MOTOR:  normal bulk and tone, full strength in the BUE, BLE  SENSORY:  normal and symmetric to light touch, temperature, vibration; SLIGHTLY DECR IN LEFT FOOT  COORDINATION:  finger-nose-finger, fine finger movements normal  REFLEXES:  deep tendon reflexes present and  symmetric  GAIT/STATION:  narrow based gait     DIAGNOSTIC DATA (LABS, IMAGING, TESTING) - I reviewed patient records, labs, notes, testing and imaging myself where available.  Lab Results  Component Value Date   WBC 7.0 10/12/2016   HGB 11.3 (L) 10/12/2016   HCT 35.5 (L) 10/12/2016   MCV 92.2 10/12/2016   PLT 218 10/12/2016      Component Value Date/Time   NA 138 10/12/2016 0512   K 3.8 10/12/2016 0512   CL 103 10/12/2016 0512   CO2 27 10/12/2016 0512   GLUCOSE 99 10/12/2016 0512   BUN 9 10/12/2016 0512   CREATININE 0.61 10/12/2016 0512   CALCIUM 8.8 (L) 10/12/2016 0512   GFRNONAA >  60 10/12/2016 0512   GFRAA >60 10/12/2016 0512   No results found for: CHOL, HDL, LDLCALC, LDLDIRECT, TRIG, CHOLHDL Lab Results  Component Value Date   HGBA1C 5.8 (H) 10/09/2016   No results found for: VITAMINB12 No results found for: TSH     ASSESSMENT AND PLAN  76 y.o. year old female here with:   Dx:  1. Double vision with both eyes open   2. Slurred speech       PLAN:  FLUCTUATION DOUBLE VISION, SPEECH DIFFICULTY - check MRI brain to rule out vascular, autoimmune or inflammatory etiologies - check lab testing to rule out myasthenia gravis, myopathy or other neuromuscular / metabolic causes  Orders Placed This Encounter  Procedures   MR BRAIN W WO CONTRAST   Acetylcholine receptor, binding   CK   Aldolase   TSH   Vitamin B12   Hemoglobin A1c   CBC with Differential/Platelet   Comprehensive metabolic panel   Meds ordered this encounter  Medications   ALPRAZolam (XANAX) 0.5 MG tablet    Sig: for sedation before MRI scan; take 1 tab 1 hour before scan; may repeat 1 tab 15 min before scan    Dispense:  3 tablet    Refill:  0    Return for pending if symptoms worsen or fail to improve, pending test results.    Penni Bombard, MD 3/84/6659, 9:35 PM Certified in Neurology, Neurophysiology and Neuroimaging  [facility] [address], [place], [place] [address] [phone]

## 2021-10-19 LAB — CBC WITH DIFFERENTIAL/PLATELET
Basophils Absolute: 0 10*3/uL (ref 0.0–0.2)
Basos: 1 %
EOS (ABSOLUTE): 0.1 10*3/uL (ref 0.0–0.4)
Eos: 2 %
Hematocrit: 43.5 % (ref 34.0–46.6)
Hemoglobin: 14.7 g/dL (ref 11.1–15.9)
Immature Grans (Abs): 0 10*3/uL (ref 0.0–0.1)
Immature Granulocytes: 0 %
Lymphocytes Absolute: 1.5 10*3/uL (ref 0.7–3.1)
Lymphs: 25 %
MCH: 30.9 pg (ref 26.6–33.0)
MCHC: 33.8 g/dL (ref 31.5–35.7)
MCV: 91 fL (ref 79–97)
Monocytes Absolute: 0.4 10*3/uL (ref 0.1–0.9)
Monocytes: 7 %
Neutrophils Absolute: 3.8 10*3/uL (ref 1.4–7.0)
Neutrophils: 65 %
Platelets: 241 10*3/uL (ref 150–450)
RBC: 4.76 x10E6/uL (ref 3.77–5.28)
RDW: 13.8 % (ref 11.7–15.4)
WBC: 5.8 10*3/uL (ref 3.4–10.8)

## 2021-10-19 LAB — COMPREHENSIVE METABOLIC PANEL
ALT: 24 IU/L (ref 0–32)
AST: 20 IU/L (ref 0–40)
Albumin/Globulin Ratio: 1.5 (ref 1.2–2.2)
Albumin: 4 g/dL (ref 3.7–4.7)
Alkaline Phosphatase: 92 IU/L (ref 44–121)
BUN/Creatinine Ratio: 21 (ref 12–28)
BUN: 18 mg/dL (ref 8–27)
Bilirubin Total: 0.2 mg/dL (ref 0.0–1.2)
CO2: 27 mmol/L (ref 20–29)
Calcium: 9.8 mg/dL (ref 8.7–10.3)
Chloride: 100 mmol/L (ref 96–106)
Creatinine, Ser: 0.84 mg/dL (ref 0.57–1.00)
Globulin, Total: 2.6 g/dL (ref 1.5–4.5)
Glucose: 130 mg/dL — ABNORMAL HIGH (ref 70–99)
Potassium: 4.3 mmol/L (ref 3.5–5.2)
Sodium: 138 mmol/L (ref 134–144)
Total Protein: 6.6 g/dL (ref 6.0–8.5)
eGFR: 72 mL/min/{1.73_m2} (ref >59)

## 2021-10-19 LAB — ACETYLCHOLINE RECEPTOR, BINDING: AChR Binding Ab, Serum: 0.04 nmol/L (ref 0.00–0.24)

## 2021-10-19 LAB — VITAMIN B12: Vitamin B-12: 1783 pg/mL — ABNORMAL HIGH (ref 232–1245)

## 2021-10-19 LAB — TSH: TSH: 2.76 u[IU]/mL (ref 0.450–4.500)

## 2021-10-19 LAB — ALDOLASE: Aldolase: 3.1 U/L — ABNORMAL LOW (ref 3.3–10.3)

## 2021-10-19 LAB — CK: Total CK: 45 U/L (ref 32–182)

## 2021-10-19 LAB — HEMOGLOBIN A1C
Est. average glucose Bld gHb Est-mCnc: 120 mg/dL
Hgb A1c MFr Bld: 5.8 % — ABNORMAL HIGH (ref 4.8–5.6)

## 2021-10-23 ENCOUNTER — Telehealth: Payer: Self-pay | Admitting: Diagnostic Neuroimaging

## 2021-10-23 NOTE — Telephone Encounter (Signed)
[employer] medicare sent to GI they obtain auth and will call the patient to schedule

## 2021-11-07 ENCOUNTER — Ambulatory Visit
Admission: RE | Admit: 2021-11-07 | Discharge: 2021-11-07 | Disposition: A | Discharge status: Home / Self Care | Payer: Medicare HMO | Source: Ambulatory Visit | Attending: Diagnostic Neuroimaging | Admitting: Diagnostic Neuroimaging

## 2021-11-07 DIAGNOSIS — H532 Diplopia: Secondary | ICD-10-CM

## 2021-11-07 DIAGNOSIS — R4781 Slurred speech: Secondary | ICD-10-CM

## 2021-11-07 MED ORDER — GADOBENATE DIMEGLUMINE 529 MG/ML IV SOLN
18.0000 mL | Freq: Once | INTRAVENOUS | Status: AC | PRN
  Administered 2021-11-07: 18 mL via INTRAVENOUS

## 2021-11-19 DIAGNOSIS — H02403 Unspecified ptosis of bilateral eyelids: Secondary | ICD-10-CM | POA: Diagnosis not present

## 2021-11-19 DIAGNOSIS — H2513 Age-related nuclear cataract, bilateral: Secondary | ICD-10-CM | POA: Diagnosis not present

## 2021-11-19 DIAGNOSIS — H532 Diplopia: Secondary | ICD-10-CM | POA: Diagnosis not present

## 2021-11-22 ENCOUNTER — Telehealth: Payer: Self-pay

## 2021-11-22 NOTE — Telephone Encounter (Signed)
-----   Message from Penni Bombard, MD sent at 11/22/2021  4:25 PM EDT ----- Unremarkable labs. Continue current plan. Please call patient. -VRP

## 2021-11-22 NOTE — Telephone Encounter (Signed)
Sent [employer] updating pt on these results.

## 2022-02-07 DIAGNOSIS — Z008 Encounter for other general examination: Secondary | ICD-10-CM | POA: Diagnosis not present

## 2022-02-07 DIAGNOSIS — Z803 Family history of malignant neoplasm of breast: Secondary | ICD-10-CM | POA: Diagnosis not present

## 2022-02-07 DIAGNOSIS — R030 Elevated blood-pressure reading, without diagnosis of hypertension: Secondary | ICD-10-CM | POA: Diagnosis not present

## 2022-02-07 DIAGNOSIS — Z6833 Body mass index (BMI) 33.0-33.9, adult: Secondary | ICD-10-CM | POA: Diagnosis not present

## 2022-02-07 DIAGNOSIS — Z85828 Personal history of other malignant neoplasm of skin: Secondary | ICD-10-CM | POA: Diagnosis not present

## 2022-02-07 DIAGNOSIS — E039 Hypothyroidism, unspecified: Secondary | ICD-10-CM | POA: Diagnosis not present

## 2022-02-07 DIAGNOSIS — E669 Obesity, unspecified: Secondary | ICD-10-CM | POA: Diagnosis not present

## 2022-02-07 DIAGNOSIS — Z87891 Personal history of nicotine dependence: Secondary | ICD-10-CM | POA: Diagnosis not present

## 2022-02-07 DIAGNOSIS — G8929 Other chronic pain: Secondary | ICD-10-CM | POA: Diagnosis not present

## 2022-02-07 DIAGNOSIS — Z8249 Family history of ischemic heart disease and other diseases of the circulatory system: Secondary | ICD-10-CM | POA: Diagnosis not present

## 2022-02-07 DIAGNOSIS — Z8673 Personal history of transient ischemic attack (TIA), and cerebral infarction without residual deficits: Secondary | ICD-10-CM | POA: Diagnosis not present

## 2022-02-12 DIAGNOSIS — Z08 Encounter for follow-up examination after completed treatment for malignant neoplasm: Secondary | ICD-10-CM | POA: Diagnosis not present

## 2022-02-12 DIAGNOSIS — D225 Melanocytic nevi of trunk: Secondary | ICD-10-CM | POA: Diagnosis not present

## 2022-02-12 DIAGNOSIS — Z85828 Personal history of other malignant neoplasm of skin: Secondary | ICD-10-CM | POA: Diagnosis not present

## 2022-02-12 DIAGNOSIS — L814 Other melanin hyperpigmentation: Secondary | ICD-10-CM | POA: Diagnosis not present

## 2022-02-12 DIAGNOSIS — L821 Other seborrheic keratosis: Secondary | ICD-10-CM | POA: Diagnosis not present

## 2022-02-12 DIAGNOSIS — D485 Neoplasm of uncertain behavior of skin: Secondary | ICD-10-CM | POA: Diagnosis not present

## 2022-02-12 DIAGNOSIS — L720 Epidermal cyst: Secondary | ICD-10-CM | POA: Diagnosis not present

## 2022-04-01 DIAGNOSIS — I700 Atherosclerosis of aorta: Secondary | ICD-10-CM | POA: Diagnosis not present

## 2022-04-01 DIAGNOSIS — Z0000 Encounter for general adult medical examination without abnormal findings: Secondary | ICD-10-CM | POA: Diagnosis not present

## 2022-04-01 DIAGNOSIS — E785 Hyperlipidemia, unspecified: Secondary | ICD-10-CM | POA: Diagnosis not present

## 2022-04-01 DIAGNOSIS — E039 Hypothyroidism, unspecified: Secondary | ICD-10-CM | POA: Diagnosis not present

## 2022-04-01 DIAGNOSIS — Z1331 Encounter for screening for depression: Secondary | ICD-10-CM | POA: Diagnosis not present

## 2022-04-01 DIAGNOSIS — R7303 Prediabetes: Secondary | ICD-10-CM | POA: Diagnosis not present

## 2022-04-01 DIAGNOSIS — Z23 Encounter for immunization: Secondary | ICD-10-CM | POA: Diagnosis not present

## 2022-09-03 ENCOUNTER — Other Ambulatory Visit: Payer: Self-pay | Admitting: Family Medicine

## 2022-09-03 DIAGNOSIS — Z0000 Encounter for general adult medical examination without abnormal findings: Secondary | ICD-10-CM

## 2022-10-04 ENCOUNTER — Ambulatory Visit
Admission: RE | Admit: 2022-10-04 | Discharge: 2022-10-04 | Disposition: A | Discharge status: Home / Self Care | Payer: Medicare HMO | Source: Ambulatory Visit | Attending: Family Medicine | Admitting: Family Medicine

## 2022-10-04 DIAGNOSIS — Z1231 Encounter for screening mammogram for malignant neoplasm of breast: Secondary | ICD-10-CM | POA: Diagnosis not present

## 2022-10-04 DIAGNOSIS — Z0000 Encounter for general adult medical examination without abnormal findings: Secondary | ICD-10-CM

## 2022-10-07 DIAGNOSIS — H2513 Age-related nuclear cataract, bilateral: Secondary | ICD-10-CM | POA: Diagnosis not present

## 2022-10-07 DIAGNOSIS — H52203 Unspecified astigmatism, bilateral: Secondary | ICD-10-CM | POA: Diagnosis not present

## 2022-10-07 DIAGNOSIS — H524 Presbyopia: Secondary | ICD-10-CM | POA: Diagnosis not present

## 2022-10-07 DIAGNOSIS — H5203 Hypermetropia, bilateral: Secondary | ICD-10-CM | POA: Diagnosis not present

## 2022-12-25 DIAGNOSIS — E039 Hypothyroidism, unspecified: Secondary | ICD-10-CM | POA: Diagnosis not present

## 2022-12-25 DIAGNOSIS — Z8673 Personal history of transient ischemic attack (TIA), and cerebral infarction without residual deficits: Secondary | ICD-10-CM | POA: Diagnosis not present

## 2022-12-25 DIAGNOSIS — N182 Chronic kidney disease, stage 2 (mild): Secondary | ICD-10-CM | POA: Diagnosis not present

## 2022-12-25 DIAGNOSIS — Z6834 Body mass index (BMI) 34.0-34.9, adult: Secondary | ICD-10-CM | POA: Diagnosis not present

## 2022-12-25 DIAGNOSIS — I129 Hypertensive chronic kidney disease with stage 1 through stage 4 chronic kidney disease, or unspecified chronic kidney disease: Secondary | ICD-10-CM | POA: Diagnosis not present

## 2022-12-25 DIAGNOSIS — Z87891 Personal history of nicotine dependence: Secondary | ICD-10-CM | POA: Diagnosis not present

## 2022-12-25 DIAGNOSIS — Z85828 Personal history of other malignant neoplasm of skin: Secondary | ICD-10-CM | POA: Diagnosis not present

## 2022-12-25 DIAGNOSIS — Z8249 Family history of ischemic heart disease and other diseases of the circulatory system: Secondary | ICD-10-CM | POA: Diagnosis not present

## 2022-12-25 DIAGNOSIS — E785 Hyperlipidemia, unspecified: Secondary | ICD-10-CM | POA: Diagnosis not present

## 2022-12-25 DIAGNOSIS — I700 Atherosclerosis of aorta: Secondary | ICD-10-CM | POA: Diagnosis not present

## 2022-12-25 DIAGNOSIS — M1990 Unspecified osteoarthritis, unspecified site: Secondary | ICD-10-CM | POA: Diagnosis not present

## 2022-12-25 DIAGNOSIS — E669 Obesity, unspecified: Secondary | ICD-10-CM | POA: Diagnosis not present

## 2023-05-18 IMAGING — MG DIGITAL DIAGNOSTIC BILAT W/ TOMO W/ CAD
8 series · 8 of 24 positions shown · non-contrast
Comparison: Priors

CLINICAL DATA: Patient for follow-up of right breast papilloma.

EXAM:
DIGITAL DIAGNOSTIC BILATERAL MAMMOGRAM WITH TOMOSYNTHESIS AND CAD
TECHNIQUE: Bilateral digital diagnostic mammography and breast tomosynthesis
was performed. The images were evaluated with computer-aided
detection.

[R CC synth-2D]
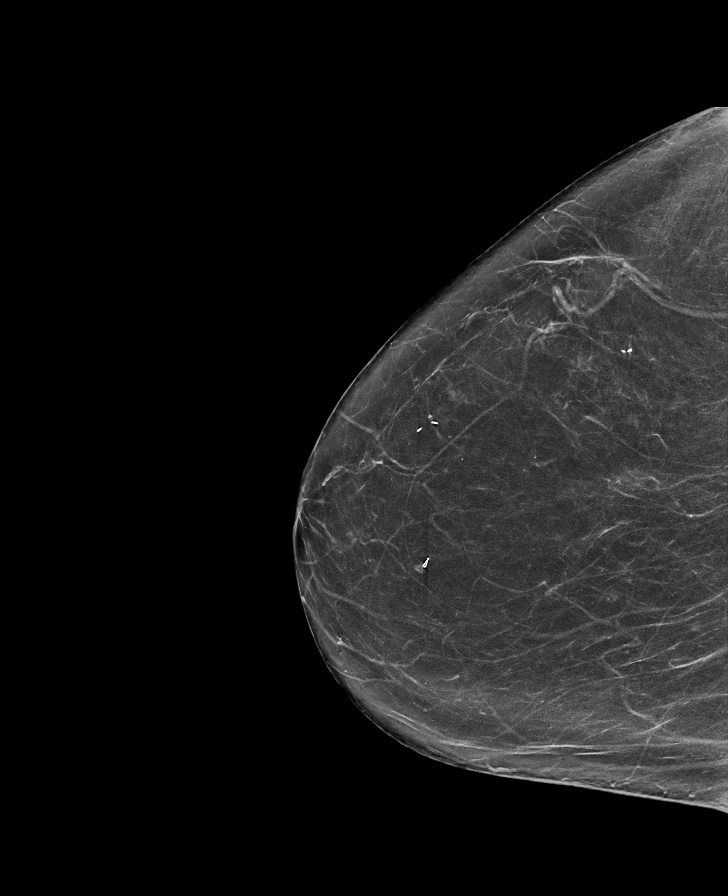

[L MLO synth-2D]
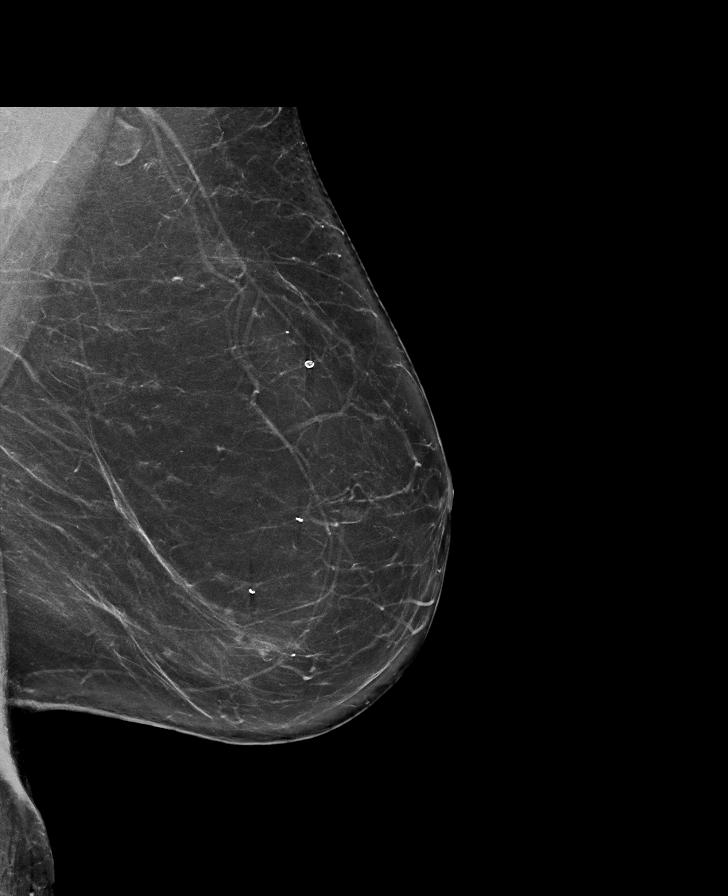

[R MLO synth-2D]
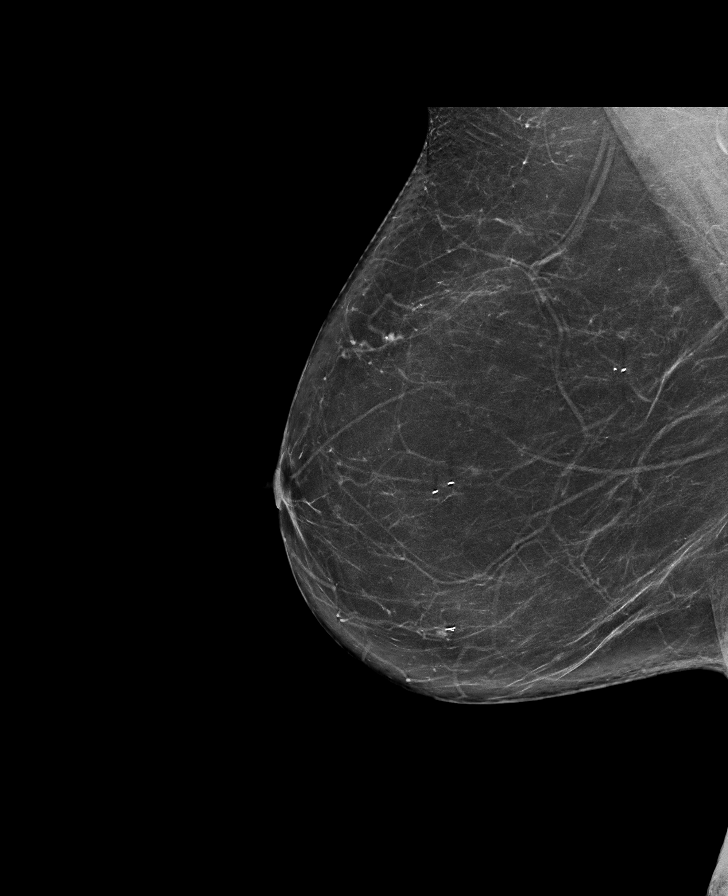

[L CC synth-2D]
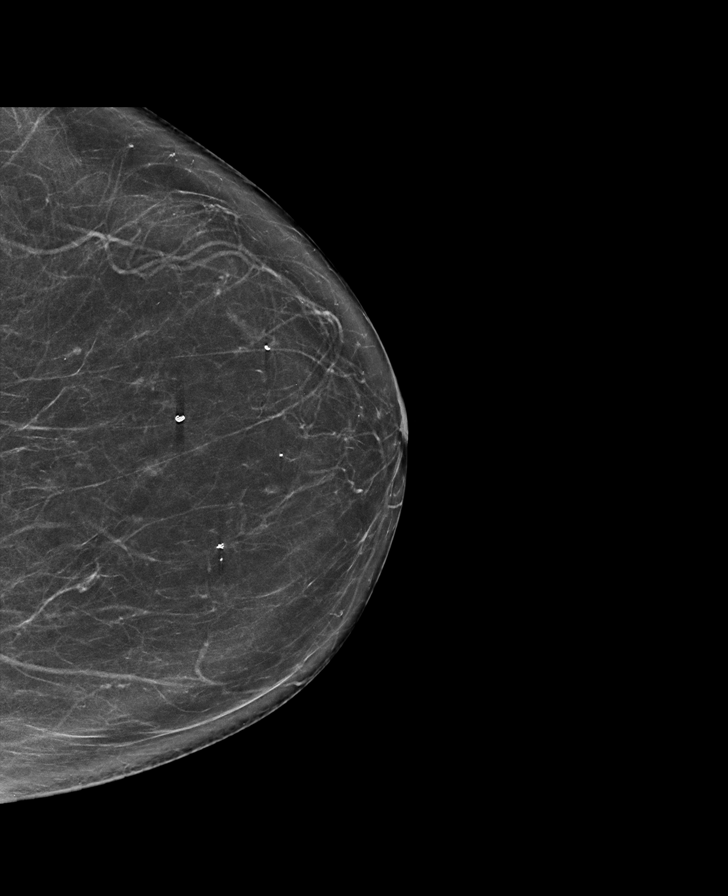

[L CC tomo · tomo slice 37/73.0]
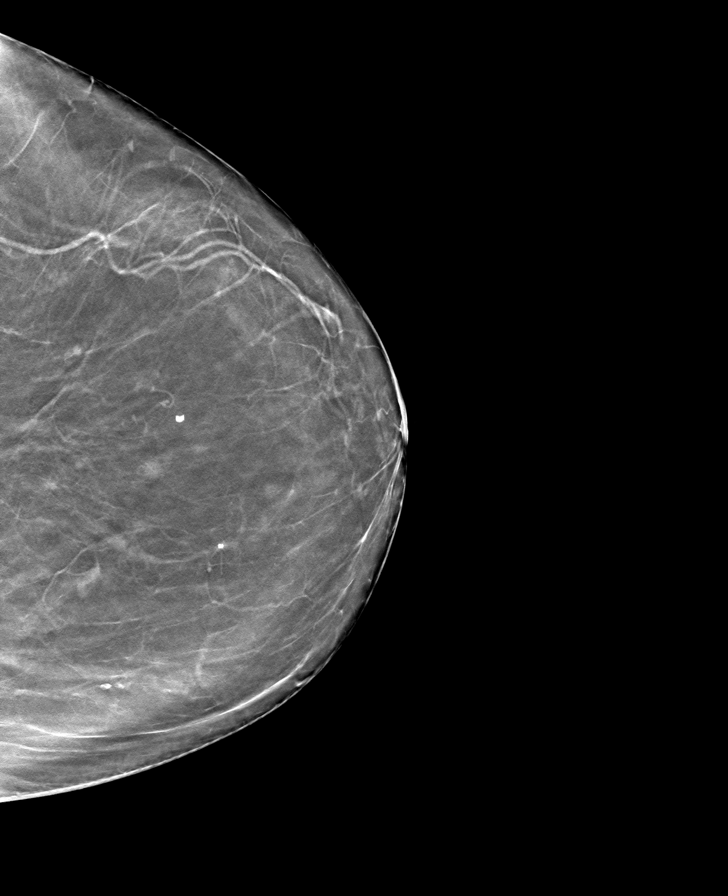

[R MLO tomo · tomo slice 41/80.0]
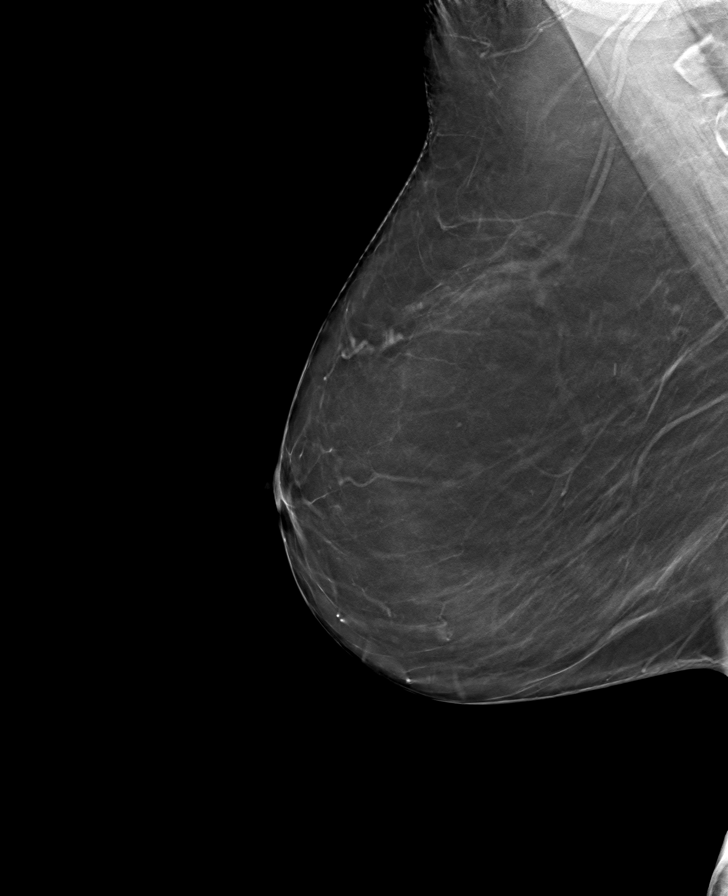

[L MLO tomo · tomo slice 43/85.0]
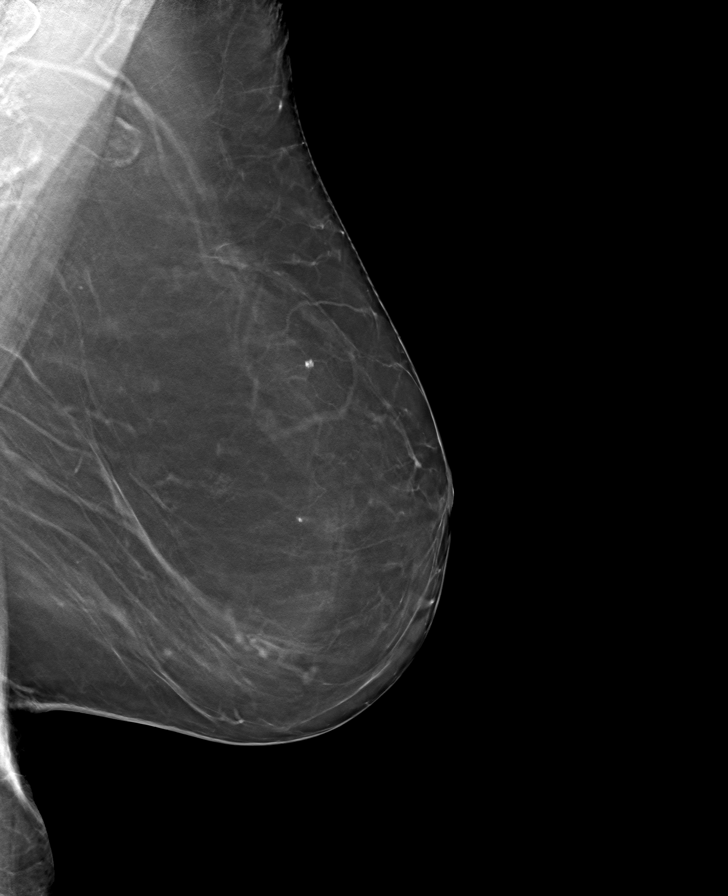

[R CC tomo · tomo slice 36/71.0]
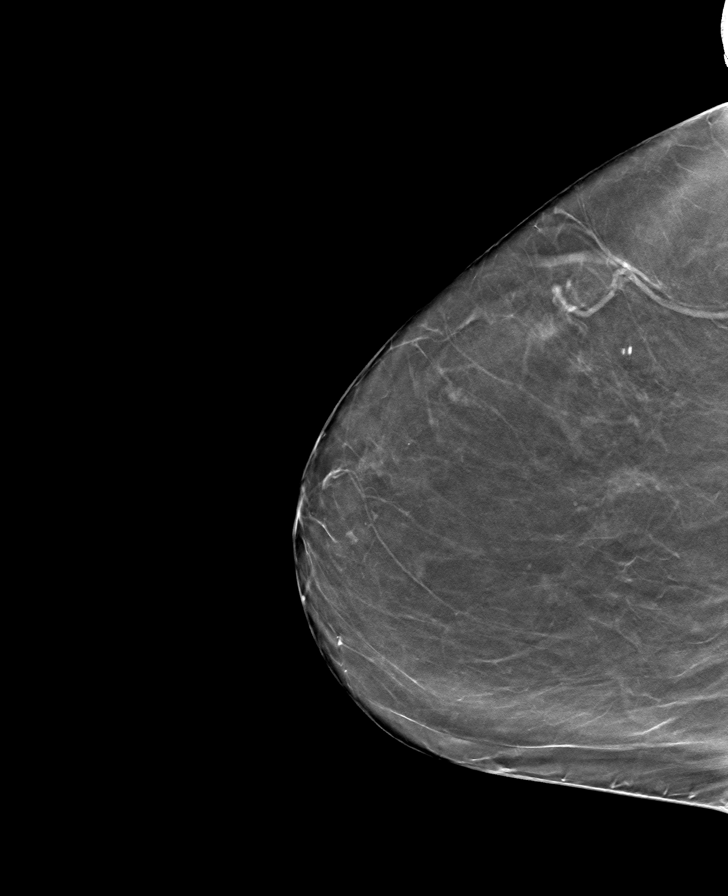

[8 of 24 positions shown; findings below may reference images not displayed]

ACR Breast Density Category b: There are scattered areas of
fibroglandular density.
FINDINGS: Grossly unchanged 3 mm mass adjacent to the biopsy marking clip
within the anterior right breast. No new masses, calcifications or
distortion identified within either breast.
IMPRESSION: Unchanged right breast mass compatible with benign papilloma.

No mammographic evidence for malignancy.

RECOMMENDATION:
Screening mammogram in one year.(Code:9S-8-H3E)

I have discussed the findings and recommendations with the patient.
If applicable, a reminder letter will be sent to the patient
regarding the next appointment.

BI-RADS CATEGORY  2: Benign.

## 2023-10-02 ENCOUNTER — Other Ambulatory Visit: Payer: Self-pay | Admitting: Family Medicine

## 2023-10-02 DIAGNOSIS — Z0000 Encounter for general adult medical examination without abnormal findings: Secondary | ICD-10-CM

## 2023-10-06 ENCOUNTER — Ambulatory Visit

## 2023-10-09 DIAGNOSIS — H5203 Hypermetropia, bilateral: Secondary | ICD-10-CM | POA: Diagnosis not present

## 2023-10-09 DIAGNOSIS — H16203 Unspecified keratoconjunctivitis, bilateral: Secondary | ICD-10-CM | POA: Diagnosis not present

## 2023-10-09 DIAGNOSIS — H10413 Chronic giant papillary conjunctivitis, bilateral: Secondary | ICD-10-CM | POA: Diagnosis not present

## 2023-10-09 DIAGNOSIS — H2513 Age-related nuclear cataract, bilateral: Secondary | ICD-10-CM | POA: Diagnosis not present

## 2023-10-13 ENCOUNTER — Ambulatory Visit

## 2023-10-20 ENCOUNTER — Ambulatory Visit
Admission: RE | Admit: 2023-10-20 | Discharge: 2023-10-20 | Disposition: A | Discharge status: Home / Self Care | Source: Ambulatory Visit | Attending: Family Medicine | Admitting: Family Medicine

## 2023-10-20 DIAGNOSIS — Z1231 Encounter for screening mammogram for malignant neoplasm of breast: Secondary | ICD-10-CM | POA: Diagnosis not present

## 2023-10-20 DIAGNOSIS — Z0000 Encounter for general adult medical examination without abnormal findings: Secondary | ICD-10-CM

## 2023-11-01 DIAGNOSIS — E785 Hyperlipidemia, unspecified: Secondary | ICD-10-CM | POA: Diagnosis not present

## 2023-11-01 DIAGNOSIS — E039 Hypothyroidism, unspecified: Secondary | ICD-10-CM | POA: Diagnosis not present

## 2023-11-07 DIAGNOSIS — H16103 Unspecified superficial keratitis, bilateral: Secondary | ICD-10-CM | POA: Diagnosis not present

## 2023-11-07 DIAGNOSIS — H01112 Allergic dermatitis of right lower eyelid: Secondary | ICD-10-CM | POA: Diagnosis not present

## 2023-11-07 DIAGNOSIS — H01111 Allergic dermatitis of right upper eyelid: Secondary | ICD-10-CM | POA: Diagnosis not present

## 2023-12-01 DIAGNOSIS — E785 Hyperlipidemia, unspecified: Secondary | ICD-10-CM | POA: Diagnosis not present

## 2023-12-01 DIAGNOSIS — E039 Hypothyroidism, unspecified: Secondary | ICD-10-CM | POA: Diagnosis not present

## 2023-12-18 DIAGNOSIS — F4381 Prolonged grief disorder: Secondary | ICD-10-CM | POA: Diagnosis not present

## 2023-12-25 DIAGNOSIS — Z008 Encounter for other general examination: Secondary | ICD-10-CM | POA: Diagnosis not present

## 2024-01-01 DIAGNOSIS — E785 Hyperlipidemia, unspecified: Secondary | ICD-10-CM | POA: Diagnosis not present

## 2024-01-01 DIAGNOSIS — E039 Hypothyroidism, unspecified: Secondary | ICD-10-CM | POA: Diagnosis not present

## 2024-02-01 DIAGNOSIS — E785 Hyperlipidemia, unspecified: Secondary | ICD-10-CM | POA: Diagnosis not present

## 2024-02-01 DIAGNOSIS — E039 Hypothyroidism, unspecified: Secondary | ICD-10-CM | POA: Diagnosis not present

## 2024-02-05 DIAGNOSIS — F4381 Prolonged grief disorder: Secondary | ICD-10-CM | POA: Diagnosis not present

## 2024-02-24 DIAGNOSIS — F4381 Prolonged grief disorder: Secondary | ICD-10-CM | POA: Diagnosis not present

## 2024-03-02 DIAGNOSIS — E039 Hypothyroidism, unspecified: Secondary | ICD-10-CM | POA: Diagnosis not present

## 2024-03-02 DIAGNOSIS — E785 Hyperlipidemia, unspecified: Secondary | ICD-10-CM | POA: Diagnosis not present

## 2024-04-02 DIAGNOSIS — E785 Hyperlipidemia, unspecified: Secondary | ICD-10-CM | POA: Diagnosis not present

## 2024-04-02 DIAGNOSIS — E039 Hypothyroidism, unspecified: Secondary | ICD-10-CM | POA: Diagnosis not present

## 2024-05-02 DIAGNOSIS — E039 Hypothyroidism, unspecified: Secondary | ICD-10-CM | POA: Diagnosis not present

## 2024-05-02 DIAGNOSIS — E785 Hyperlipidemia, unspecified: Secondary | ICD-10-CM | POA: Diagnosis not present
